# Patient Record
Sex: Male | Born: 1975 | Race: White | Hispanic: No | Marital: Married | Smoking: Current every day smoker
Health system: Southern US, Community
[De-identification: ages and names within clinical notes are randomized; demographics above are authoritative.]

## PROBLEM LIST (undated history)

## (undated) DIAGNOSIS — K5792 Diverticulitis of intestine, part unspecified, without perforation or abscess without bleeding: Secondary | ICD-10-CM

## (undated) DIAGNOSIS — T7840XA Allergy, unspecified, initial encounter: Secondary | ICD-10-CM

## (undated) HISTORY — DX: Allergy, unspecified, initial encounter: T78.40XA

## (undated) HISTORY — PX: SHOULDER SURGERY: SHX246

---

## 1999-03-01 HISTORY — PX: VASECTOMY: SHX75

## 1999-03-01 HISTORY — PX: OTHER SURGICAL HISTORY: SHX169

## 2007-07-03 ENCOUNTER — Encounter
Admission: RE | Admit: 2007-07-03 | Discharge: 2007-07-06 | Payer: Self-pay | Admitting: Physical Medicine & Rehabilitation

## 2007-07-06 ENCOUNTER — Ambulatory Visit: Payer: Self-pay | Admitting: Physical Medicine & Rehabilitation

## 2011-03-07 ENCOUNTER — Other Ambulatory Visit (HOSPITAL_COMMUNITY): Payer: Self-pay | Admitting: Internal Medicine

## 2011-03-07 DIAGNOSIS — R109 Unspecified abdominal pain: Secondary | ICD-10-CM

## 2011-03-09 ENCOUNTER — Other Ambulatory Visit (HOSPITAL_COMMUNITY): Payer: Self-pay

## 2011-03-10 ENCOUNTER — Other Ambulatory Visit (HOSPITAL_COMMUNITY): Payer: Self-pay

## 2011-03-11 ENCOUNTER — Ambulatory Visit (HOSPITAL_COMMUNITY)
Admission: RE | Admit: 2011-03-11 | Discharge: 2011-03-11 | Disposition: A | Discharge status: Home / Self Care | Payer: 59 | Source: Ambulatory Visit | Attending: Internal Medicine | Admitting: Internal Medicine

## 2011-03-11 ENCOUNTER — Other Ambulatory Visit (HOSPITAL_COMMUNITY): Payer: Self-pay | Admitting: Internal Medicine

## 2011-03-11 DIAGNOSIS — M25559 Pain in unspecified hip: Secondary | ICD-10-CM | POA: Insufficient documentation

## 2011-03-11 DIAGNOSIS — R109 Unspecified abdominal pain: Secondary | ICD-10-CM

## 2012-03-01 ENCOUNTER — Encounter (HOSPITAL_COMMUNITY): Payer: Self-pay | Admitting: Pharmacy Technician

## 2012-03-01 ENCOUNTER — Emergency Department (HOSPITAL_COMMUNITY)
Admission: EM | Admit: 2012-03-01 | Discharge: 2012-03-01 | Disposition: A | Discharge status: Home / Self Care | Payer: 59 | Attending: Emergency Medicine | Admitting: Emergency Medicine

## 2012-03-01 ENCOUNTER — Emergency Department (HOSPITAL_COMMUNITY): Payer: 59

## 2012-03-01 ENCOUNTER — Encounter (HOSPITAL_COMMUNITY): Payer: Self-pay | Admitting: *Deleted

## 2012-03-01 ENCOUNTER — Encounter (HOSPITAL_COMMUNITY)
Admission: RE | Admit: 2012-03-01 | Discharge: 2012-03-01 | Disposition: A | Discharge status: Home / Self Care | Payer: 59 | Source: Ambulatory Visit | Attending: General Surgery | Admitting: General Surgery

## 2012-03-01 ENCOUNTER — Encounter (HOSPITAL_COMMUNITY): Payer: Self-pay

## 2012-03-01 ENCOUNTER — Other Ambulatory Visit (HOSPITAL_COMMUNITY): Payer: 59

## 2012-03-01 DIAGNOSIS — K56609 Unspecified intestinal obstruction, unspecified as to partial versus complete obstruction: Secondary | ICD-10-CM

## 2012-03-01 DIAGNOSIS — F17200 Nicotine dependence, unspecified, uncomplicated: Secondary | ICD-10-CM | POA: Insufficient documentation

## 2012-03-01 DIAGNOSIS — R112 Nausea with vomiting, unspecified: Secondary | ICD-10-CM | POA: Insufficient documentation

## 2012-03-01 DIAGNOSIS — K4090 Unilateral inguinal hernia, without obstruction or gangrene, not specified as recurrent: Secondary | ICD-10-CM | POA: Insufficient documentation

## 2012-03-01 LAB — CBC WITH DIFFERENTIAL/PLATELET
Basophils Absolute: 0 10*3/uL (ref 0.0–0.1)
Basophils Relative: 0 % (ref 0–1)
Eosinophils Absolute: 0 10*3/uL (ref 0.0–0.7)
Eosinophils Relative: 0 % (ref 0–5)
HCT: 48.3 % (ref 39.0–52.0)
Hemoglobin: 17.2 g/dL — ABNORMAL HIGH (ref 13.0–17.0)
Lymphocytes Relative: 19 % (ref 12–46)
Lymphs Abs: 2.8 10*3/uL (ref 0.7–4.0)
MCH: 31.3 pg (ref 26.0–34.0)
MCHC: 35.6 g/dL (ref 30.0–36.0)
MCV: 87.8 fL (ref 78.0–100.0)
Monocytes Absolute: 0.9 10*3/uL (ref 0.1–1.0)
Monocytes Relative: 6 % (ref 3–12)
Neutro Abs: 11.3 10*3/uL — ABNORMAL HIGH (ref 1.7–7.7)
Neutrophils Relative %: 75 % (ref 43–77)
Platelets: 270 10*3/uL (ref 150–400)
RBC: 5.5 MIL/uL (ref 4.22–5.81)
RDW: 13.2 % (ref 11.5–15.5)
WBC: 15 10*3/uL — ABNORMAL HIGH (ref 4.0–10.5)

## 2012-03-01 LAB — COMPREHENSIVE METABOLIC PANEL
ALT: 14 U/L (ref 0–53)
AST: 15 U/L (ref 0–37)
Albumin: 4.2 g/dL (ref 3.5–5.2)
Alkaline Phosphatase: 67 U/L (ref 39–117)
BUN: 10 mg/dL (ref 6–23)
CO2: 25 mEq/L (ref 19–32)
Calcium: 10.3 mg/dL (ref 8.4–10.5)
Chloride: 100 mEq/L (ref 96–112)
Creatinine, Ser: 0.72 mg/dL (ref 0.50–1.35)
GFR calc Af Amer: >90 mL/min (ref >90)
GFR calc non Af Amer: >90 mL/min (ref >90)
Glucose, Bld: 121 mg/dL — ABNORMAL HIGH (ref 70–99)
Potassium: 3.6 mEq/L (ref 3.5–5.1)
Sodium: 139 mEq/L (ref 135–145)
Total Bilirubin: 0.4 mg/dL (ref 0.3–1.2)
Total Protein: 7.8 g/dL (ref 6.0–8.3)

## 2012-03-01 LAB — SURGICAL PCR SCREEN: Staphylococcus aureus: NEGATIVE

## 2012-03-01 LAB — LIPASE, BLOOD: Lipase: 39 U/L (ref 11–59)

## 2012-03-01 MED ORDER — ONDANSETRON HCL 4 MG/2ML IJ SOLN
4.0000 mg | Freq: Once | INTRAMUSCULAR | Status: AC
  Administered 2012-03-01: 4 mg via INTRAVENOUS
  Filled 2012-03-01: qty 2

## 2012-03-01 MED ORDER — HYDROMORPHONE HCL PF 1 MG/ML IJ SOLN
1.0000 mg | Freq: Once | INTRAMUSCULAR | Status: AC
  Administered 2012-03-01: 1 mg via INTRAVENOUS
  Filled 2012-03-01: qty 1

## 2012-03-01 MED ORDER — OXYCODONE-ACETAMINOPHEN 7.5-325 MG PO TABS: 1.0000 | ORAL_TABLET | ORAL | Status: DC | PRN

## 2012-03-01 MED ORDER — SODIUM CHLORIDE 0.9 % IV BOLUS (SEPSIS)
1000.0000 mL | Freq: Once | INTRAVENOUS | Status: AC
  Administered 2012-03-01: 1000 mL via INTRAVENOUS

## 2012-03-01 MED ORDER — IOHEXOL 300 MG/ML  SOLN
100.0000 mL | Freq: Once | INTRAMUSCULAR | Status: AC | PRN
  Administered 2012-03-01: 100 mL via INTRAVENOUS

## 2012-03-01 MED ORDER — PROMETHAZINE HCL 50 MG PO TABS: 50.0000 mg | ORAL_TABLET | Freq: Four times a day (QID) | ORAL | Status: DC | PRN

## 2012-03-01 MED ORDER — SODIUM CHLORIDE 0.9 % IV SOLN
Freq: Once | INTRAVENOUS | Status: AC
  Administered 2012-03-01: 11:00:00 via INTRAVENOUS

## 2012-03-01 MED ORDER — GI COCKTAIL ~~LOC~~
30.0000 mL | Freq: Once | ORAL | Status: AC
  Administered 2012-03-01: 30 mL via ORAL
  Filled 2012-03-01: qty 30

## 2012-03-01 MED ORDER — METOCLOPRAMIDE HCL 5 MG/ML IJ SOLN
10.0000 mg | Freq: Once | INTRAMUSCULAR | Status: AC
  Administered 2012-03-01: 10 mg via INTRAVENOUS
  Filled 2012-03-01: qty 2

## 2012-03-01 NOTE — ED Notes (Signed)
Pt vomited large amount of clear liquid. Pt reports drinking " a lot of water this morning"

## 2012-03-01 NOTE — Consult Note (Signed)
Patient seen in emergency room for incarcerated right inguinal hernia. The hernia reduced spontaneously. The patient has been scheduled for an elective right inguinal herniorrhaphy on 03/05/2012. Please see H&P. Discussed with Dr. Juleen China.

## 2012-03-01 NOTE — ED Notes (Signed)
NGT was attempted by two RN's and 2 different sizes tubes. All attempts unsuccessful. Dr. Juleen China aware.
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
## 2012-03-01 NOTE — Patient Instructions (Addendum)
20 Jordan Banks  03/01/2012   Your procedure is scheduled on:   03/05/2012  Report to [facility] at  1000 AM.  Call this number if you have problems the morning of surgery: [phone]   Remember:   Do not eat food:After Midnight.  May have clear liquids:until Midnight .    Take these medicines the morning of surgery with A SIP OF WATER: percocet,phenergan   Do not wear jewelry, make-up or nail polish.  Do not wear lotions, powders, or perfumes.   Do not shave 48 hours prior to surgery. Men may shave face and neck.  Do not bring valuables to the hospital.  Contacts, dentures or bridgework may not be worn into surgery.  Leave suitcase in the car. After surgery it may be brought to your room.  For patients admitted to the hospital, checkout time is 11:00 AM the day of discharge.   Patients discharged the day of surgery will not be allowed to drive home.  Name and phone number of your driver: family Special Instructions: Shower using CHG 2 nights before surgery and the night before surgery.  If you shower the day of surgery use CHG.  Use special wash - you have one bottle of CHG for all showers.  You should use approximately 1/3 of the bottle for each shower.   Please read over the following fact sheets that you were given: Pain Booklet, Coughing and Deep Breathing, MRSA Information, Surgical Site Infection Prevention, Anesthesia Post-op Instructions and Care and Recovery After Surgery Inguinal Hernia, Adult Muscles help keep everything in the body in its proper place. But if a weak spot in the muscles develops, something can poke through. That is called a hernia. When this happens in the lower part of the belly (abdomen), it is called an inguinal hernia. (It takes its name from a part of the body in this region called the inguinal canal.) A weak spot in the wall of muscles lets some fat or part of the small intestine bulge through. An inguinal hernia can develop at any age. Men get them  more often than women. CAUSES  In adults, an inguinal hernia develops over time.  It can be triggered by:  Suddenly straining the muscles of the lower abdomen.  Lifting heavy objects.  Straining to have a bowel movement. Difficult bowel movements (constipation) can lead to this.  Constant coughing. This may be caused by smoking or lung disease.  Being overweight.  Being pregnant.  Working at a job that requires long periods of standing or heavy lifting.  Having had an inguinal hernia before. One type can be an emergency situation. It is called a strangulated inguinal hernia. It develops if part of the small intestine slips through the weak spot and cannot get back into the abdomen. The blood supply can be cut off. If that happens, part of the intestine may die. This situation requires emergency surgery. SYMPTOMS  Often, a small inguinal hernia has no symptoms. It is found when a healthcare provider does a physical exam. Larger hernias usually have symptoms.   In adults, symptoms may include:  A lump in the groin. This is easier to see when the person is standing. It might disappear when lying down.  In men, a lump in the scrotum.  Pain or burning in the groin. This occurs especially when lifting, straining or coughing.  A dull ache or feeling of pressure in the groin.  Signs of a strangulated hernia can  include:  A bulge in the groin that becomes very painful and tender to the touch.  A bulge that turns red or purple.  Fever, nausea and vomiting.  Inability to have a bowel movement or to pass gas. DIAGNOSIS  To decide if you have an inguinal hernia, a healthcare provider will probably do a physical examination.  This will include asking questions about any symptoms you have noticed.  The healthcare provider might feel the groin area and ask you to cough. If an inguinal hernia is felt, the healthcare provider may try to slide it back into the abdomen.  Usually no  other tests are needed. TREATMENT  Treatments can vary. The size of the hernia makes a difference. Options include:  Watchful waiting. This is often suggested if the hernia is small and you have had no symptoms.  No medical procedure will be done unless symptoms develop.  You will need to watch closely for symptoms. If any occur, contact your healthcare provider right away.  Surgery. This is used if the hernia is larger or you have symptoms.  Open surgery. This is usually an outpatient procedure (you will not stay overnight in a hospital). An cut (incision) is made through the skin in the groin. The hernia is put back inside the abdomen. The weak area in the muscles is then repaired by herniorrhaphy or hernioplasty. Herniorrhaphy: in this type of surgery, the weak muscles are sewn back together. Hernioplasty: a patch or mesh is used to close the weak area in the abdominal wall.  Laparoscopy. In this procedure, a surgeon makes small incisions. A thin tube with a tiny video camera (called a laparoscope) is put into the abdomen. The surgeon repairs the hernia with mesh by looking with the video camera and using two long instruments. HOME CARE INSTRUCTIONS   After surgery to repair an inguinal hernia:  You will need to take pain medicine prescribed by your healthcare provider. Follow all directions carefully.  You will need to take care of the wound from the incision.  Your activity will be restricted for awhile. This will probably include no heavy lifting for several weeks. You also should not do anything too active for a few weeks. When you can return to work will depend on the type of job that you have.  During "watchful waiting" periods, you should:  Maintain a healthy weight.  Eat a diet high in fiber (fruits, vegetables and whole grains).  Drink plenty of fluids to avoid constipation. This means drinking enough water and other liquids to keep your urine clear or pale yellow.  Do  not lift heavy objects.  Do not stand for long periods of time.  Quit smoking. This should keep you from developing a frequent cough. SEEK MEDICAL CARE IF:   A bulge develops in your groin area.  You feel pain, a burning sensation or pressure in the groin. This might be worse if you are lifting or straining.  You develop a fever of more than 100.5 F (38.1 C). SEEK IMMEDIATE MEDICAL CARE IF:   Pain in the groin increases suddenly.  A bulge in the groin gets bigger suddenly and does not go down.  For men, there is sudden pain in the scrotum. Or, the size of the scrotum increases.  A bulge in the groin area becomes red or purple and is painful to touch.  You have nausea or vomiting that does not go away.  You feel your heart beating much faster than normal.  You cannot have a bowel movement or pass gas.  You develop a fever of more than 102.0 F (38.9 C). Document Released: 07/03/2008 Document Revised: 05/09/2011 Document Reviewed: 07/03/2008 [facility] Patient Information 2013 [place], [place]. PATIENT INSTRUCTIONS POST-ANESTHESIA  IMMEDIATELY FOLLOWING SURGERY:  Do not drive or operate machinery for the first twenty four hours after surgery.  Do not make any important decisions for twenty four hours after surgery or while taking narcotic pain medications or sedatives.  If you develop intractable nausea and vomiting or a severe headache please notify your doctor immediately.  FOLLOW-UP:  Please make an appointment with your surgeon as instructed. You do not need to follow up with anesthesia unless specifically instructed to do so.  WOUND CARE INSTRUCTIONS (if applicable):  Keep a dry clean dressing on the anesthesia/puncture wound site if there is drainage.  Once the wound has quit draining you may leave it open to air.  Generally you should leave the bandage intact for twenty four hours unless there is drainage.  If the epidural site drains for more than 36-48 hours please call  the anesthesia department.  QUESTIONS?:  Please feel free to call your physician or the hospital operator if you have any questions, and they will be happy to assist you.

## 2012-03-01 NOTE — ED Notes (Signed)
Jordan Banks in CT called- pt finished with water.

## 2012-03-01 NOTE — ED Notes (Signed)
Pt c/o abd pain that started yesteday, pt reports that he had the gi "bug" on Sunday, got better, abd pain started yesterday, n/v last night, does report constipation, last bowel Sunday when he had the "gi bug",.

## 2012-03-01 NOTE — H&P (Signed)
Jordan Banks is an 36 y.o. male.   Chief Complaint: Incarcerated right inguinal hernia HPI: Patient is a 36-year-old white male who presented to the emergency room with worsening right groin pain, swelling, and nausea. CT scan of the abdomen and pelvis revealed an incarcerated right inguinal hernia. It had small bowel incarcerated in it. The patient was given pain medication. A surgery consultation was obtained, but the hernia reduced spontaneously. The patient is now presenting to the operating room for elective right inguinal herniorrhaphy.  History reviewed. No pertinent past medical history.  Past Surgical History  Procedure Date  . Shoulder surgery     No family history on file. Social History:  reports that he has been smoking.  He does not have any smokeless tobacco history on file. He reports that he uses illicit drugs (Marijuana). He reports that he does not drink alcohol.  Allergies: No Known Allergies   (Not in a hospital admission)  Results for orders placed during the hospital encounter of 03/01/12 (from the past 48 hour(s))  CBC WITH DIFFERENTIAL     Status: Abnormal   Collection Time   03/01/12  7:58 AM      Component Value Range Comment   WBC 15.0 (*) 4.0 - 10.5 K/uL    RBC 5.50  4.22 - 5.81 MIL/uL    Hemoglobin 17.2 (*) 13.0 - 17.0 g/dL    HCT 48.3  39.0 - 52.0 %    MCV 87.8  78.0 - 100.0 fL    MCH 31.3  26.0 - 34.0 pg    MCHC 35.6  30.0 - 36.0 g/dL    RDW 13.2  11.5 - 15.5 %    Platelets 270  150 - 400 K/uL    Neutrophils Relative 75  43 - 77 %    Neutro Abs 11.3 (*) 1.7 - 7.7 K/uL    Lymphocytes Relative 19  12 - 46 %    Lymphs Abs 2.8  0.7 - 4.0 K/uL    Monocytes Relative 6  3 - 12 %    Monocytes Absolute 0.9  0.1 - 1.0 K/uL    Eosinophils Relative 0  0 - 5 %    Eosinophils Absolute 0.0  0.0 - 0.7 K/uL    Basophils Relative 0  0 - 1 %    Basophils Absolute 0.0  0.0 - 0.1 K/uL   COMPREHENSIVE METABOLIC PANEL     Status: Abnormal   Collection Time   03/01/12  7:58 AM      Component Value Range Comment   Sodium 139  135 - 145 mEq/L    Potassium 3.6  3.5 - 5.1 mEq/L    Chloride 100  96 - 112 mEq/L    CO2 25  19 - 32 mEq/L    Glucose, Bld 121 (*) 70 - 99 mg/dL    BUN 10  6 - 23 mg/dL    Creatinine, Ser 0.72  0.50 - 1.35 mg/dL    Calcium 10.3  8.4 - 10.5 mg/dL    Total Protein 7.8  6.0 - 8.3 g/dL    Albumin 4.2  3.5 - 5.2 g/dL    AST 15  0 - 37 U/L    ALT 14  0 - 53 U/L    Alkaline Phosphatase 67  39 - 117 U/L    Total Bilirubin 0.4  0.3 - 1.2 mg/dL    GFR calc non Af Amer >90  >90 mL/min    GFR calc Af Amer >90  >  90 mL/min   LIPASE, BLOOD     Status: Normal   Collection Time   03/01/12  7:58 AM      Component Value Range Comment   Lipase 39  11 - 59 U/L    Ct Abdomen Pelvis W Contrast  03/01/2012  *RADIOLOGY REPORT*  Clinical Data: Epigastric pain with nausea and vomiting.  CT ABDOMEN AND PELVIS WITH CONTRAST  Technique:  Multidetector CT imaging of the abdomen and pelvis was performed following the standard protocol during bolus administration of intravenous contrast.  Contrast: 100mL OMNIPAQUE IOHEXOL 300 MG/ML  SOLN  Comparison: None.  Findings: Lung bases show minimal dependent atelectasis.  Heart size normal.  Scattered sub centimeter low attenuation lesions in the liver are too small to characterize but likely represent cysts and/or hemangiomas.  Gallbladder, adrenal glands, kidneys, spleen, and pancreas are unremarkable.  Stomach is dilated with oral contrast.  Duodenal and proximal jejunum are nondistended.  There are dilated loops of fluid-filled mid small bowel in the central and left abdomen, to the level of a right inguinal hernia (image 39). Distal small bowel is decompressed.  Colon is decompressed and unremarkable.  Small pelvic free fluid.  No free air.  Small left inguinal hernia contains fat.  Scattered atherosclerotic calcification of the arterial vasculature without abdominal aortic aneurysm.  No pathologically enlarged  lymph nodes.  Scattered small bowel mesenteric lymph nodes are sub centimeter in short axis size.  No worrisome lytic or sclerotic lesions.  Old fractures involving the pelvis are noted.  IMPRESSION:  1.  Small bowel obstruction secondary to an incarcerated right inguinal hernia. 2.  Small pelvic free fluid.   Original Report Authenticated By: Melinda Blietz, M.D.     Review of Systems  Constitutional: Negative.   HENT: Negative.   Eyes: Negative.   Respiratory: Negative.   Cardiovascular: Negative.   Gastrointestinal: Positive for nausea and abdominal pain.  Genitourinary: Negative.   Musculoskeletal: Negative.   Skin: Negative.   Neurological: Negative.   Endo/Heme/Allergies: Negative.   Psychiatric/Behavioral: Negative.     Blood pressure 105/57, pulse 64, temperature 97.3 F (36.3 C), resp. rate 18, height 6' 1" (1.854 m), weight 90.719 kg (200 lb), SpO2 97.00%. Physical Exam  Vitals reviewed. Constitutional: He is oriented to person, place, and time. He appears well-developed and well-nourished.  HENT:  Head: Normocephalic and atraumatic.  Neck: Normal range of motion. Neck supple.  Cardiovascular: Normal rate, regular rhythm and normal heart sounds.   Respiratory: Effort normal and breath sounds normal.  GI: Soft. Bowel sounds are normal. He exhibits no distension. There is no rebound.       Reducible right inguinal hernia noted.  Small left inguinal hernia.  Musculoskeletal: Normal range of motion.  Neurological: He is alert and oriented to person, place, and time.  Skin: Skin is warm and dry.  Psychiatric: He has a normal mood and affect. His behavior is normal. Judgment and thought content normal.     Assessment/Plan Impression: Right inguinal hernia Plan: The patient is scheduled for a right inguinal herniorrhaphy on 03/05/2012. The risks and benefits of the procedure including bleeding, infection, and the possibility of recurrence of the hernia were fully explained to  the patient, who gave informed consent.  Rik Wadel A 03/01/2012, 11:14 AM    

## 2012-03-01 NOTE — ED Notes (Signed)
Dr. Lovell Sheehan in and discussed options with pt. Pt now up for DC and scheduled for surgery Monday 03-05-12.

## 2012-03-01 NOTE — ED Notes (Signed)
Pt requesting more pain medication.

## 2012-03-01 NOTE — ED Notes (Addendum)
Dr. Lovell Sheehan in room to assess pt

## 2012-03-05 ENCOUNTER — Ambulatory Visit (HOSPITAL_COMMUNITY): Payer: 59 | Admitting: Anesthesiology

## 2012-03-05 ENCOUNTER — Encounter (HOSPITAL_COMMUNITY): Payer: Self-pay | Admitting: *Deleted

## 2012-03-05 ENCOUNTER — Encounter (HOSPITAL_COMMUNITY)
Admission: RE | Disposition: A | Discharge status: Home / Self Care | Payer: Self-pay | Source: Ambulatory Visit | Attending: General Surgery

## 2012-03-05 ENCOUNTER — Encounter (HOSPITAL_COMMUNITY): Payer: Self-pay | Admitting: Anesthesiology

## 2012-03-05 ENCOUNTER — Ambulatory Visit (HOSPITAL_COMMUNITY)
Admission: RE | Admit: 2012-03-05 | Discharge: 2012-03-05 | Disposition: A | Discharge status: Home / Self Care | Payer: 59 | Source: Ambulatory Visit | Attending: General Surgery | Admitting: General Surgery

## 2012-03-05 DIAGNOSIS — K4090 Unilateral inguinal hernia, without obstruction or gangrene, not specified as recurrent: Secondary | ICD-10-CM | POA: Insufficient documentation

## 2012-03-05 HISTORY — PX: INGUINAL HERNIA REPAIR: SHX194

## 2012-03-05 HISTORY — PX: INSERTION OF MESH: SHX5868

## 2012-03-05 SURGERY — REPAIR, HERNIA, INGUINAL, ADULT
Anesthesia: General | Site: Groin | Laterality: Right | Wound class: Clean

## 2012-03-05 MED ORDER — KETOROLAC TROMETHAMINE 30 MG/ML IJ SOLN
INTRAMUSCULAR | Status: AC
  Filled 2012-03-05: qty 1

## 2012-03-05 MED ORDER — ONDANSETRON HCL 4 MG/2ML IJ SOLN: 4.0000 mg | Freq: Once | INTRAMUSCULAR | Status: DC | PRN

## 2012-03-05 MED ORDER — CEFAZOLIN SODIUM-DEXTROSE 2-3 GM-% IV SOLR
2.0000 g | INTRAVENOUS | Status: AC
  Administered 2012-03-05: 2 g via INTRAVENOUS

## 2012-03-05 MED ORDER — ENOXAPARIN SODIUM 40 MG/0.4ML ~~LOC~~ SOLN
SUBCUTANEOUS | Status: AC
  Filled 2012-03-05: qty 0.4

## 2012-03-05 MED ORDER — BUPIVACAINE LIPOSOME 1.3 % IJ SUSP
20.0000 mL | Freq: Once | INTRAMUSCULAR | Status: DC
  Filled 2012-03-05: qty 20

## 2012-03-05 MED ORDER — OXYCODONE-ACETAMINOPHEN 7.5-325 MG PO TABS: 1.0000 | ORAL_TABLET | ORAL | Status: DC | PRN

## 2012-03-05 MED ORDER — FENTANYL CITRATE 0.05 MG/ML IJ SOLN
INTRAMUSCULAR | Status: AC
  Filled 2012-03-05: qty 2

## 2012-03-05 MED ORDER — MIDAZOLAM HCL 2 MG/2ML IJ SOLN
1.0000 mg | INTRAMUSCULAR | Status: DC | PRN
  Administered 2012-03-05 (×2): 2 mg via INTRAVENOUS

## 2012-03-05 MED ORDER — LIDOCAINE HCL (CARDIAC) 10 MG/ML IV SOLN
INTRAVENOUS | Status: DC | PRN
  Administered 2012-03-05: 20 mg via INTRAVENOUS

## 2012-03-05 MED ORDER — MIDAZOLAM HCL 2 MG/2ML IJ SOLN
INTRAMUSCULAR | Status: AC
  Filled 2012-03-05: qty 2

## 2012-03-05 MED ORDER — BUPIVACAINE LIPOSOME 1.3 % IJ SUSP
INTRAMUSCULAR | Status: DC | PRN
  Administered 2012-03-05: 20 mL

## 2012-03-05 MED ORDER — KETOROLAC TROMETHAMINE 30 MG/ML IJ SOLN
30.0000 mg | Freq: Once | INTRAMUSCULAR | Status: AC
  Administered 2012-03-05: 30 mg via INTRAVENOUS

## 2012-03-05 MED ORDER — LACTATED RINGERS IV SOLN
INTRAVENOUS | Status: DC
  Administered 2012-03-05: 11:00:00 via INTRAVENOUS

## 2012-03-05 MED ORDER — PROPOFOL 10 MG/ML IV BOLUS
INTRAVENOUS | Status: DC | PRN
  Administered 2012-03-05: 150 mg via INTRAVENOUS
  Administered 2012-03-05: 30 mg via INTRAVENOUS

## 2012-03-05 MED ORDER — PROPOFOL 10 MG/ML IV EMUL
INTRAVENOUS | Status: AC
  Filled 2012-03-05: qty 20

## 2012-03-05 MED ORDER — FENTANYL CITRATE 0.05 MG/ML IJ SOLN
INTRAMUSCULAR | Status: DC | PRN
  Administered 2012-03-05 (×4): 50 ug via INTRAVENOUS

## 2012-03-05 MED ORDER — ENOXAPARIN SODIUM 40 MG/0.4ML ~~LOC~~ SOLN
40.0000 mg | Freq: Once | SUBCUTANEOUS | Status: AC
  Administered 2012-03-05: 40 mg via SUBCUTANEOUS

## 2012-03-05 MED ORDER — CHLORHEXIDINE GLUCONATE 4 % EX LIQD: 1.0000 "application " | Freq: Once | CUTANEOUS | Status: DC

## 2012-03-05 MED ORDER — FENTANYL CITRATE 0.05 MG/ML IJ SOLN
25.0000 ug | INTRAMUSCULAR | Status: DC | PRN
  Administered 2012-03-05 (×2): 50 ug via INTRAVENOUS

## 2012-03-05 MED ORDER — LIDOCAINE HCL (PF) 1 % IJ SOLN
INTRAMUSCULAR | Status: AC
  Filled 2012-03-05: qty 5

## 2012-03-05 MED ORDER — CEFAZOLIN SODIUM-DEXTROSE 2-3 GM-% IV SOLR
INTRAVENOUS | Status: AC
  Filled 2012-03-05: qty 50

## 2012-03-05 MED ORDER — SODIUM CHLORIDE 0.9 % IR SOLN
Status: DC | PRN
  Administered 2012-03-05: 1000 mL

## 2012-03-05 SURGICAL SUPPLY — 45 items
BAG HAMPER (MISCELLANEOUS) ×2 IMPLANT
CLOTH BEACON ORANGE TIMEOUT ST (SAFETY) ×2 IMPLANT
COVER LIGHT HANDLE STERIS (MISCELLANEOUS) ×4 IMPLANT
DECANTER SPIKE VIAL GLASS SM (MISCELLANEOUS) IMPLANT
DERMABOND ADVANCED (GAUZE/BANDAGES/DRESSINGS) ×1
DERMABOND ADVANCED .7 DNX12 (GAUZE/BANDAGES/DRESSINGS) ×1 IMPLANT
DRAIN PENROSE 18X.75 LTX STRL (MISCELLANEOUS) ×2 IMPLANT
ELECT REM PT RETURN 9FT ADLT (ELECTROSURGICAL) ×4
ELECTRODE REM PT RTRN 9FT ADLT (ELECTROSURGICAL) ×2 IMPLANT
FORMALIN 10 PREFIL 120ML (MISCELLANEOUS) ×2 IMPLANT
GLOVE BIO SURGEON STRL SZ7.5 (GLOVE) ×2 IMPLANT
GLOVE BIOGEL PI IND STRL 6.5 (GLOVE) ×1 IMPLANT
GLOVE BIOGEL PI IND STRL 7.0 (GLOVE) ×3 IMPLANT
GLOVE BIOGEL PI INDICATOR 6.5 (GLOVE) ×1
GLOVE BIOGEL PI INDICATOR 7.0 (GLOVE) ×3
GLOVE ECLIPSE 6.5 STRL STRAW (GLOVE) ×2 IMPLANT
GLOVE ECLIPSE 7.5 STRL STRAW (GLOVE) ×2 IMPLANT
GLOVE INDICATOR 7.5 STRL GRN (GLOVE) ×2 IMPLANT
GLOVE SS BIOGEL STRL SZ 6.5 (GLOVE) ×2 IMPLANT
GLOVE SUPERSENSE BIOGEL SZ 6.5 (GLOVE) ×2
GOWN STRL REIN XL XLG (GOWN DISPOSABLE) ×10 IMPLANT
INST SET MINOR GENERAL (KITS) ×2 IMPLANT
KIT ROOM TURNOVER APOR (KITS) ×2 IMPLANT
MANIFOLD NEPTUNE II (INSTRUMENTS) ×2 IMPLANT
MESH MARLEX PLUG MEDIUM (Mesh General) ×2 IMPLANT
NEEDLE HYPO 18GX1.5 BLUNT FILL (NEEDLE) ×2 IMPLANT
NEEDLE HYPO 25X1 1.5 SAFETY (NEEDLE) ×2 IMPLANT
NS IRRIG 1000ML POUR BTL (IV SOLUTION) ×2 IMPLANT
PACK MINOR (CUSTOM PROCEDURE TRAY) ×2 IMPLANT
PAD ARMBOARD 7.5X6 YLW CONV (MISCELLANEOUS) ×2 IMPLANT
SET BASIN LINEN APH (SET/KITS/TRAYS/PACK) ×2 IMPLANT
SOL PREP PROV IODINE SCRUB 4OZ (MISCELLANEOUS) ×2 IMPLANT
SUT NOVA NAB GS-22 2 2-0 T-19 (SUTURE) ×4 IMPLANT
SUT NOVAFIL NAB HGS22 2-0 30IN (SUTURE) ×2 IMPLANT
SUT SILK 3 0 (SUTURE)
SUT SILK 3-0 18XBRD TIE 12 (SUTURE) IMPLANT
SUT VIC AB 2-0 CT1 27 (SUTURE)
SUT VIC AB 2-0 CT1 TAPERPNT 27 (SUTURE) IMPLANT
SUT VIC AB 3-0 SH 27 (SUTURE) ×1
SUT VIC AB 3-0 SH 27X BRD (SUTURE) ×1 IMPLANT
SUT VIC AB 4-0 PS2 27 (SUTURE) ×4 IMPLANT
SUT VICRYL AB 3 0 TIES (SUTURE) IMPLANT
SYR 20CC LL (SYRINGE) ×2 IMPLANT
SYR CONTROL 10ML LL (SYRINGE) ×2 IMPLANT
TOWEL OR 17X26 4PK STRL BLUE (TOWEL DISPOSABLE) ×2 IMPLANT

## 2012-03-05 NOTE — Anesthesia Preprocedure Evaluation (Addendum)
Anesthesia Evaluation  Patient identified by MRN, date of birth, ID band Patient awake    Reviewed: Allergy & Precautions, H&P , NPO status , Patient's Chart, lab work & pertinent test results  Airway Mallampati: I  TM Distance: >3 FB     Dental  (+) Teeth Intact   Pulmonary Current Smoker,    breath sounds clear to auscultation       Cardiovascular negative cardio ROS   Rhythm:Regular Rate:Normal     Neuro/Psych    GI/Hepatic negative GI ROS,   Endo/Other    Renal/GU      Musculoskeletal   Abdominal   Peds  Hematology   Anesthesia Other Findings   Reproductive/Obstetrics                             Anesthesia Physical Anesthesia Plan  ASA: I  Anesthesia Plan: General   Post-op Pain Management:    Induction: Intravenous  Airway Management Planned: LMA  Additional Equipment:   Intra-op Plan:   Post-operative Plan: Extubation in OR  Informed Consent: I have reviewed the patients History and Physical, chart, labs and discussed the procedure including the risks, benefits and alternatives for the proposed anesthesia with the patient or authorized representative who has indicated his/her understanding and acceptance.     Plan Discussed with:   Anesthesia Plan Comments:         Anesthesia Quick Evaluation  

## 2012-03-05 NOTE — H&P (View-Only) (Signed)
Jordan Banks is an 37 y.o. male.   Chief Complaint: Incarcerated right inguinal hernia HPI: Patient is a 37 year old white male who presented to the emergency room with worsening right groin pain, swelling, and nausea. CT scan of the abdomen and pelvis revealed an incarcerated right inguinal hernia. It had small bowel incarcerated in it. The patient was given pain medication. A surgery consultation was obtained, but the hernia reduced spontaneously. The patient is now presenting to the operating room for elective right inguinal herniorrhaphy.  History reviewed. No pertinent past medical history.  Past Surgical History  Procedure Date  . Shoulder surgery     No family history on file. Social History:  reports that he has been smoking.  He does not have any smokeless tobacco history on file. He reports that he uses illicit drugs (Marijuana). He reports that he does not drink alcohol.  Allergies: No Known Allergies   (Not in a hospital admission)  Results for orders placed during the hospital encounter of 03/01/12 (from the past 48 hour(s))  CBC WITH DIFFERENTIAL     Status: Abnormal   Collection Time   03/01/12  7:58 AM      Component Value Range Comment   WBC 15.0 (*) 4.0 - 10.5 K/uL    RBC 5.50  4.22 - 5.81 MIL/uL    Hemoglobin 17.2 (*) 13.0 - 17.0 g/dL    HCT 09.8  11.9 - 14.7 %    MCV 87.8  78.0 - 100.0 fL    MCH 31.3  26.0 - 34.0 pg    MCHC 35.6  30.0 - 36.0 g/dL    RDW 82.9  56.2 - 13.0 %    Platelets 270  150 - 400 K/uL    Neutrophils Relative 75  43 - 77 %    Neutro Abs 11.3 (*) 1.7 - 7.7 K/uL    Lymphocytes Relative 19  12 - 46 %    Lymphs Abs 2.8  0.7 - 4.0 K/uL    Monocytes Relative 6  3 - 12 %    Monocytes Absolute 0.9  0.1 - 1.0 K/uL    Eosinophils Relative 0  0 - 5 %    Eosinophils Absolute 0.0  0.0 - 0.7 K/uL    Basophils Relative 0  0 - 1 %    Basophils Absolute 0.0  0.0 - 0.1 K/uL   COMPREHENSIVE METABOLIC PANEL     Status: Abnormal   Collection Time   03/01/12  7:58 AM      Component Value Range Comment   Sodium 139  135 - 145 mEq/L    Potassium 3.6  3.5 - 5.1 mEq/L    Chloride 100  96 - 112 mEq/L    CO2 25  19 - 32 mEq/L    Glucose, Bld 121 (*) 70 - 99 mg/dL    BUN 10  6 - 23 mg/dL    Creatinine, Ser 8.65  0.50 - 1.35 mg/dL    Calcium 78.4  8.4 - 10.5 mg/dL    Total Protein 7.8  6.0 - 8.3 g/dL    Albumin 4.2  3.5 - 5.2 g/dL    AST 15  0 - 37 U/L    ALT 14  0 - 53 U/L    Alkaline Phosphatase 67  39 - 117 U/L    Total Bilirubin 0.4  0.3 - 1.2 mg/dL    GFR calc non Af Amer >90  >90 mL/min    GFR calc Af Amer >90  >  90 mL/min   LIPASE, BLOOD     Status: Normal   Collection Time   03/01/12  7:58 AM      Component Value Range Comment   Lipase 39  11 - 59 U/L    Ct Abdomen Pelvis W Contrast  03/01/2012  *RADIOLOGY REPORT*  Clinical Data: Epigastric pain with nausea and vomiting.  CT ABDOMEN AND PELVIS WITH CONTRAST  Technique:  Multidetector CT imaging of the abdomen and pelvis was performed following the standard protocol during bolus administration of intravenous contrast.  Contrast: OMNIPAQUE IOHEXOL 300 MG/ML  SOLN  Comparison: None.  Findings: Lung bases show minimal dependent atelectasis.  Heart size normal.  Scattered sub centimeter low attenuation lesions in the liver are too small to characterize but likely represent cysts and/or hemangiomas.  Gallbladder, adrenal glands, kidneys, spleen, and pancreas are unremarkable.  Stomach is dilated with oral contrast.  Duodenal and proximal jejunum are nondistended.  There are dilated loops of fluid-filled mid small bowel in the central and left abdomen, to the level of a right inguinal hernia (image 39). Distal small bowel is decompressed.  Colon is decompressed and unremarkable.  Small pelvic free fluid.  No free air.  Small left inguinal hernia contains fat.  Scattered atherosclerotic calcification of the arterial vasculature without abdominal aortic aneurysm.  No pathologically enlarged  lymph nodes.  Scattered small bowel mesenteric lymph nodes are sub centimeter in short axis size.  No worrisome lytic or sclerotic lesions.  Old fractures involving the pelvis are noted.  IMPRESSION:  1.  Small bowel obstruction secondary to an incarcerated right inguinal hernia. 2.  Small pelvic free fluid.   Original Report Authenticated By: Leanna Battles, M.D.     Review of Systems  Constitutional: Negative.   HENT: Negative.   Eyes: Negative.   Respiratory: Negative.   Cardiovascular: Negative.   Gastrointestinal: Positive for nausea and abdominal pain.  Genitourinary: Negative.   Musculoskeletal: Negative.   Skin: Negative.   Neurological: Negative.   Endo/Heme/Allergies: Negative.   Psychiatric/Behavioral: Negative.     Blood pressure 105/57, pulse 64, temperature 97.3 F (36.3 C), resp. rate 18, height 6\' 1"  (1.854 m), weight 90.719 kg (200 lb), SpO2 97.00%. Physical Exam  Vitals reviewed. Constitutional: He is oriented to person, place, and time. He appears well-developed and well-nourished.  HENT:  Head: Normocephalic and atraumatic.  Neck: Normal range of motion. Neck supple.  Cardiovascular: Normal rate, regular rhythm and normal heart sounds.   Respiratory: Effort normal and breath sounds normal.  GI: Soft. Bowel sounds are normal. He exhibits no distension. There is no rebound.       Reducible right inguinal hernia noted.  Small left inguinal hernia.  Musculoskeletal: Normal range of motion.  Neurological: He is alert and oriented to person, place, and time.  Skin: Skin is warm and dry.  Psychiatric: He has a normal mood and affect. His behavior is normal. Judgment and thought content normal.     Assessment/Plan Impression: Right inguinal hernia Plan: The patient is scheduled for a right inguinal herniorrhaphy on 03/05/2012. The risks and benefits of the procedure including bleeding, infection, and the possibility of recurrence of the hernia were fully explained to  the patient, who gave informed consent.  Elaina Cara A 03/01/2012, 11:14 AM

## 2012-03-05 NOTE — Interval H&P Note (Signed)
History and Physical Interval Note:  03/05/2012 11:01 AM  Jordan Banks  has presented today for surgery, with the diagnosis of Right inguinal hernia  The various methods of treatment have been discussed with the patient and family. After consideration of risks, benefits and other options for treatment, the patient has consented to  Procedure(s) (LRB) with comments: HERNIA REPAIR INGUINAL ADULT (Right) as a surgical intervention .  The patient's history has been reviewed, patient examined, no change in status, stable for surgery.  I have reviewed the patient's chart and labs.  Questions were answered to the patient's satisfaction.     Franky Macho A

## 2012-03-05 NOTE — Op Note (Signed)
Patient:  Jordan Banks  DOB:  Feb 18, 1976  MRN:  [id number]   Preop Diagnosis:  Right inguinal hernia  Postop Diagnosis:  Same  Procedure:  Right inguinal herniorrhaphy with mesh  Surgeon:  Franky Macho, M.D.  Anes:  General  Indications:  Patient is a 37 year old white male who presents with a symptomatic right inguinal hernia. The risks and benefits of the procedure including bleeding, infection, chronic pain, and the possibility of recurrence of the hernia were fully explained to the patient, who gave informed consent.  Procedure note:  The patient was placed the supine position. After general anesthesia was administered, the right groin region was prepped and draped using the usual sterile technique with Betadine. Surgical site confirmation was performed.  A right groin incision was made down to the external oblique aponeuroses. The aponeuroses was incised to the external ring. A Penrose drain was placed around the spermatic cord. The vase deferens was noted within the spermatic cord. A direct hernia sac was found. This was freed away from the spermatic cord up to the peritoneal reflection and inverted. A Bard PerFix plug, medium size, was then inserted into this area and secured to the transversalis fascia using 2-0 Novafil interrupted sutures. An onlay patch was then placed along the floor of inguinal canal. The superior aspect was secured to the conjoined tendon and the inferior aspect secured to the shelving edge of Poupart's ligament using 2-0 Novafil interrupted sutures. The internal ring was recreated using a 20 Novafil interrupted suture. Care was taken to avoid the ilioinguinal nerve. The external oblique aponeuroses was reapproximated using a 2-0 Vicryl running suture. The subcutaneous layer was reapproximated using 3-0 Vicryl interrupted suture. Exparel was then injected into the fascia and surrounding subcutaneous tissue. The skin was closed using a 4-0 Vicryl subcuticular  suture. Dermabond was then applied.  All tape and needle counts were correct at the end of the procedure. The patient was awakened and transferred to PACU in stable condition.  Complications:  None  EBL:  Minimal  Specimen:  None

## 2012-03-05 NOTE — Transfer of Care (Signed)
Immediate Anesthesia Transfer of Care Note  Patient: Jordan Banks  Procedure(s) Performed: Procedure(s) (LRB): HERNIA REPAIR INGUINAL ADULT (Right) INSERTION OF MESH (Right)  Patient Location: PACU  Anesthesia Type: General  Level of Consciousness: awake  Airway & Oxygen Therapy: Patient Spontanous Breathing and non-rebreather face mask  Post-op Assessment: Report given to PACU RN, Post -op Vital signs reviewed and stable and Patient moving all extremities  Post vital signs: Reviewed and stable  Complications: No apparent anesthesia complications

## 2012-03-05 NOTE — Anesthesia Procedure Notes (Signed)
Procedure Name: LMA Insertion Date/Time: 03/05/2012 11:26 AM Performed by: Franco Nones Pre-anesthesia Checklist: Patient identified, Patient being monitored, Emergency Drugs available, Timeout performed and Suction available Patient Re-evaluated:Patient Re-evaluated prior to inductionOxygen Delivery Method: Circle System Utilized Preoxygenation: Pre-oxygenation with 100% oxygen Intubation Type: IV induction Ventilation: Mask ventilation without difficulty LMA: LMA inserted LMA Size: 4.0 Number of attempts: 1 Placement Confirmation: positive ETCO2 and breath sounds checked- equal and bilateral

## 2012-03-05 NOTE — Anesthesia Postprocedure Evaluation (Signed)
Anesthesia Post Note  Patient: Jordan Banks  Procedure(s) Performed: Procedure(s) (LRB): HERNIA REPAIR INGUINAL ADULT (Right) INSERTION OF MESH (Right)  Anesthesia type: General  Patient location: PACU  Post pain: Pain level controlled  Post assessment: Post-op Vital signs reviewed, Patient's Cardiovascular Status Stable, Respiratory Function Stable, Patent Airway, No signs of Nausea or vomiting and Pain level controlled  Last Vitals:  Filed Vitals:   03/05/12 1238  BP: 136/89  Pulse: 56  Temp: 36.3 C  Resp: 15    Post vital signs: Reviewed and stable  Level of consciousness: awake and alert   Complications: No apparent anesthesia complications

## 2012-03-06 ENCOUNTER — Encounter (HOSPITAL_COMMUNITY): Payer: Self-pay | Admitting: General Surgery

## 2014-04-18 ENCOUNTER — Other Ambulatory Visit (HOSPITAL_COMMUNITY): Payer: Self-pay | Admitting: Family Medicine

## 2014-04-18 ENCOUNTER — Ambulatory Visit (HOSPITAL_COMMUNITY)
Admission: RE | Admit: 2014-04-18 | Discharge: 2014-04-18 | Disposition: A | Discharge status: Home / Self Care | Payer: BLUE CROSS/BLUE SHIELD | Source: Ambulatory Visit | Attending: Family Medicine | Admitting: Family Medicine

## 2014-04-18 DIAGNOSIS — M5432 Sciatica, left side: Secondary | ICD-10-CM

## 2014-04-18 DIAGNOSIS — M5440 Lumbago with sciatica, unspecified side: Secondary | ICD-10-CM | POA: Insufficient documentation

## 2014-04-18 DIAGNOSIS — M5431 Sciatica, right side: Secondary | ICD-10-CM

## 2014-04-21 ENCOUNTER — Other Ambulatory Visit (HOSPITAL_COMMUNITY): Payer: Self-pay | Admitting: Family Medicine

## 2014-04-21 DIAGNOSIS — M5416 Radiculopathy, lumbar region: Secondary | ICD-10-CM

## 2014-04-21 DIAGNOSIS — M5432 Sciatica, left side: Secondary | ICD-10-CM

## 2014-04-21 DIAGNOSIS — M545 Low back pain: Secondary | ICD-10-CM

## 2014-04-28 ENCOUNTER — Ambulatory Visit (HOSPITAL_COMMUNITY)
Admission: RE | Admit: 2014-04-28 | Discharge: 2014-04-28 | Disposition: A | Discharge status: Home / Self Care | Payer: BLUE CROSS/BLUE SHIELD | Source: Ambulatory Visit | Attending: Family Medicine | Admitting: Family Medicine

## 2014-04-28 DIAGNOSIS — M5416 Radiculopathy, lumbar region: Secondary | ICD-10-CM

## 2014-04-28 DIAGNOSIS — M5127 Other intervertebral disc displacement, lumbosacral region: Secondary | ICD-10-CM | POA: Insufficient documentation

## 2014-04-28 DIAGNOSIS — M545 Low back pain: Secondary | ICD-10-CM | POA: Diagnosis present

## 2014-04-28 DIAGNOSIS — M5137 Other intervertebral disc degeneration, lumbosacral region: Secondary | ICD-10-CM | POA: Diagnosis not present

## 2014-04-28 DIAGNOSIS — M79605 Pain in left leg: Secondary | ICD-10-CM | POA: Diagnosis not present

## 2014-04-28 DIAGNOSIS — M5136 Other intervertebral disc degeneration, lumbar region: Secondary | ICD-10-CM | POA: Insufficient documentation

## 2014-04-28 DIAGNOSIS — M5432 Sciatica, left side: Secondary | ICD-10-CM

## 2015-08-02 ENCOUNTER — Encounter (HOSPITAL_COMMUNITY): Payer: Self-pay | Admitting: *Deleted

## 2015-08-02 ENCOUNTER — Emergency Department (HOSPITAL_COMMUNITY): Payer: BLUE CROSS/BLUE SHIELD

## 2015-08-02 ENCOUNTER — Emergency Department (HOSPITAL_COMMUNITY)
Admission: EM | Admit: 2015-08-02 | Discharge: 2015-08-02 | Disposition: A | Discharge status: Home / Self Care | Payer: BLUE CROSS/BLUE SHIELD | Attending: Emergency Medicine | Admitting: Emergency Medicine

## 2015-08-02 DIAGNOSIS — K5732 Diverticulitis of large intestine without perforation or abscess without bleeding: Secondary | ICD-10-CM | POA: Insufficient documentation

## 2015-08-02 DIAGNOSIS — K4090 Unilateral inguinal hernia, without obstruction or gangrene, not specified as recurrent: Secondary | ICD-10-CM

## 2015-08-02 DIAGNOSIS — F17210 Nicotine dependence, cigarettes, uncomplicated: Secondary | ICD-10-CM | POA: Insufficient documentation

## 2015-08-02 LAB — COMPREHENSIVE METABOLIC PANEL
ALT: 19 U/L (ref 17–63)
ANION GAP: 7 (ref 5–15)
AST: 20 U/L (ref 15–41)
Albumin: 3.9 g/dL (ref 3.5–5.0)
Alkaline Phosphatase: 60 U/L (ref 38–126)
BILIRUBIN TOTAL: 0.4 mg/dL (ref 0.3–1.2)
BUN: 13 mg/dL (ref 6–20)
CHLORIDE: 106 mmol/L (ref 101–111)
CO2: 26 mmol/L (ref 22–32)
Calcium: 9.1 mg/dL (ref 8.9–10.3)
Creatinine, Ser: 0.85 mg/dL (ref 0.61–1.24)
Glucose, Bld: 93 mg/dL (ref 65–99)
POTASSIUM: 4.1 mmol/L (ref 3.5–5.1)
Sodium: 139 mmol/L (ref 135–145)
TOTAL PROTEIN: 7.4 g/dL (ref 6.5–8.1)

## 2015-08-02 LAB — CBC WITH DIFFERENTIAL/PLATELET
Basophils Absolute: 0 10*3/uL (ref 0.0–0.1)
Basophils Relative: 0 %
EOS PCT: 2 %
Eosinophils Absolute: 0.3 10*3/uL (ref 0.0–0.7)
HEMATOCRIT: 44.7 % (ref 39.0–52.0)
Hemoglobin: 15.1 g/dL (ref 13.0–17.0)
LYMPHS PCT: 37 %
Lymphs Abs: 4.7 10*3/uL — ABNORMAL HIGH (ref 0.7–4.0)
MCH: 30.7 pg (ref 26.0–34.0)
MCHC: 33.8 g/dL (ref 30.0–36.0)
MCV: 90.9 fL (ref 78.0–100.0)
MONO ABS: 0.7 10*3/uL (ref 0.1–1.0)
MONOS PCT: 6 %
NEUTROS ABS: 7.1 10*3/uL (ref 1.7–7.7)
Neutrophils Relative %: 55 %
PLATELETS: 289 10*3/uL (ref 150–400)
RBC: 4.92 MIL/uL (ref 4.22–5.81)
RDW: 13.2 % (ref 11.5–15.5)
WBC: 12.9 10*3/uL — ABNORMAL HIGH (ref 4.0–10.5)

## 2015-08-02 LAB — URINALYSIS, ROUTINE W REFLEX MICROSCOPIC
Bilirubin Urine: NEGATIVE
Glucose, UA: NEGATIVE mg/dL
HGB URINE DIPSTICK: NEGATIVE
Ketones, ur: NEGATIVE mg/dL
Leukocytes, UA: NEGATIVE
NITRITE: NEGATIVE
PROTEIN: NEGATIVE mg/dL
Specific Gravity, Urine: 1.02 (ref 1.005–1.030)
pH: 6 (ref 5.0–8.0)

## 2015-08-02 MED ORDER — AMOXICILLIN-POT CLAVULANATE 875-125 MG PO TABS: 1.0000 | ORAL_TABLET | Freq: Two times a day (BID) | ORAL | Status: DC

## 2015-08-02 MED ORDER — ONDANSETRON HCL 4 MG/2ML IJ SOLN
4.0000 mg | Freq: Once | INTRAMUSCULAR | Status: AC
  Administered 2015-08-02: 4 mg via INTRAVENOUS
  Filled 2015-08-02: qty 2

## 2015-08-02 MED ORDER — HYDROCODONE-ACETAMINOPHEN 5-325 MG PO TABS
1.0000 | ORAL_TABLET | Freq: Once | ORAL | Status: AC
  Administered 2015-08-02: 1 via ORAL
  Filled 2015-08-02: qty 1

## 2015-08-02 MED ORDER — MORPHINE SULFATE (PF) 4 MG/ML IV SOLN
4.0000 mg | Freq: Once | INTRAVENOUS | Status: AC
  Administered 2015-08-02: 4 mg via INTRAVENOUS
  Filled 2015-08-02: qty 1

## 2015-08-02 MED ORDER — IOPAMIDOL (ISOVUE-300) INJECTION 61%
100.0000 mL | Freq: Once | INTRAVENOUS | Status: AC | PRN
  Administered 2015-08-02: 100 mL via INTRAVENOUS

## 2015-08-02 MED ORDER — SODIUM CHLORIDE 0.9% FLUSH
INTRAVENOUS | Status: AC
  Filled 2015-08-02: qty 30

## 2015-08-02 MED ORDER — DIATRIZOATE MEGLUMINE & SODIUM 66-10 % PO SOLN
ORAL | Status: AC
  Filled 2015-08-02: qty 30

## 2015-08-02 NOTE — ED Provider Notes (Signed)
CSN: [id number]     Arrival date & time 08/02/15  0321 History   First MD Initiated Contact with Patient 08/02/15 0350     Chief Complaint  Patient presents with  . Inguinal Hernia     (Consider location/radiation/quality/duration/timing/severity/associated sxs/prior Treatment) HPI Comments: Patient states he's had a left inguinal hernia for at least 6 months that is getting worse. Reports pain with palpation that comes and goes. States the hernia bulges out occasionally as he does lifting at work. He came in today because the pain is too bad. He denies any nausea or vomiting. He's been moving his bowels normally. He denies any obstipation. Denies any fever. History of  right inguinal hernia repair in 2014. He has not seen a Careers adviser since. he states he is always able to reduce his hernia himself but sometimes it comes out when he stands up.  No testicular pain. No blood in the urine.   The history is provided by the patient.    History reviewed. No pertinent past medical history. Past Surgical History  Procedure Laterality Date  . Shoulder surgery    . Arm surgery  [facility]  . Vasectomy  [address]  . Inguinal hernia repair  03/05/2012    Procedure: HERNIA REPAIR INGUINAL ADULT;  Surgeon: Dalia Heading, MD;  Location: [facility];  Service: General;  Laterality: Right;  . Insertion of mesh  03/05/2012    Procedure: INSERTION OF MESH;  Surgeon: Dalia Heading, MD;  Location: [facility];  Service: General;  Laterality: Right;   History reviewed. No pertinent family history. Social History  Substance Use Topics  . Smoking status: Current Every Day Smoker -- 1.00 packs/day for 20 years    Types: Cigarettes  . Smokeless tobacco: None  . Alcohol Use: No    Review of Systems  Constitutional: Negative for fever, activity change, appetite change and fatigue.  HENT: Negative for congestion.   Respiratory: Negative for cough, chest tightness and shortness of breath.    Cardiovascular: Negative for chest pain.  Gastrointestinal: Positive for abdominal pain. Negative for nausea, vomiting and diarrhea.  Genitourinary: Negative for dysuria, hematuria and testicular pain.  Musculoskeletal: Negative for myalgias, arthralgias and neck pain.  Neurological: Negative for dizziness, weakness, numbness and headaches.  A complete 10 system review of systems was obtained and all systems are negative except as noted in the HPI and PMH.      Allergies  Review of patient's allergies indicates no known allergies.  Home Medications   Prior to Admission medications   Medication Sig Start Date End Date Taking? Authorizing Provider  amoxicillin-clavulanate (AUGMENTIN) 875-125 MG tablet Take 1 tablet by mouth every 12 (twelve) hours. 08/02/15   Glynn Octave, MD   BP 103/61 mmHg  Pulse 78  Temp(Src) 97.8 F (36.6 C) (Oral)  Resp 18  Ht  (1.854 m)  Wt 210 lb (95.255 kg)  BMI 27.71 kg/m2  SpO2 98% Physical Exam  Constitutional: He is oriented to person, place, and time. He appears well-developed and well-nourished. No distress.  HENT:  Head: Normocephalic and atraumatic.  Mouth/Throat: Oropharynx is clear and moist. No oropharyngeal exudate.  Eyes: Conjunctivae and EOM are normal. Pupils are equal, round, and reactive to light.  Neck: Normal range of motion. Neck supple.  No meningismus.  Cardiovascular: Normal rate, regular rhythm, normal heart sounds and intact distal pulses.   No murmur heard. Pulmonary/Chest: Effort normal and breath sounds normal.  No respiratory distress.  Abdominal: Soft. There is tenderness. There is no rebound and no guarding.  TTP L inguinal region. No obvious hernia.   Genitourinary:  Testicles nontender  Musculoskeletal: Normal range of motion. He exhibits no edema or tenderness.  Neurological: He is alert and oriented to person, place, and time. No cranial nerve deficit. He exhibits normal muscle tone. Coordination normal.   No ataxia on finger to nose bilaterally. No pronator drift. 5/5 strength throughout. CN 2-12 intact.Equal grip strength. Sensation intact.   Skin: Skin is warm.  Psychiatric: He has a normal mood and affect. His behavior is normal.  Nursing note and vitals reviewed.   ED Course  Procedures (including critical care time) Labs Review Labs Reviewed  CBC WITH DIFFERENTIAL/PLATELET - Abnormal; Notable for the following:    WBC 12.9 (*)    Lymphs Abs 4.7 (*)    All other components within normal limits  COMPREHENSIVE METABOLIC PANEL  URINALYSIS, ROUTINE W REFLEX MICROSCOPIC (NOT [facility])    Imaging Review Ct Abdomen Pelvis W Contrast  08/02/2015  CLINICAL DATA:  Left lower quadrant sharp intermittent abdominal pain EXAM: CT ABDOMEN AND PELVIS WITH CONTRAST TECHNIQUE: Multidetector CT imaging of the abdomen and pelvis was performed using the standard protocol following bolus administration of intravenous contrast. CONTRAST:  100mL ISOVUE-300 IOPAMIDOL (ISOVUE-300) INJECTION 61% COMPARISON:  03/01/2012 FINDINGS: Lower chest and abdominal wall: Fatty left inguinal hernia, also seen previously. Bowel containing right inguinal hernia on the previous study has been repaired. Mild atelectasis at the right base. Hepatobiliary: No focal liver abnormality.No evidence of biliary obstruction or stone. Pancreas: Unremarkable. Spleen: Unremarkable. Adrenals/Urinary Tract: Negative adrenals. No hydronephrosis or stone. Prominent bladder wall thickness but underdistended. Reproductive:No pathologic findings. Stomach/Bowel: Colonic diverticulosis with active inflammation around a thickened distal transverse diverticulum. No abscess or perforation. Diverticulosis is extensive for age. No bowel obstruction or appendicitis. Vascular/Lymphatic: No acute vascular abnormality. No mass or adenopathy. Peritoneal: No ascites or pneumoperitoneum. Musculoskeletal: No acute finding. Remote obturator ring fractures with  heterotopic ossification extending from the symphysis pubis. Disc degeneration and facet arthropathy. 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 IMPRESSION: 1. Uncomplicated transverse diverticulitis. 2. Fatty left inguinal hernia. No recurrence of repaired right inguinal hernia. Electronically Signed   By: Marnee SpringJonathon  Watts M.D.   On: 08/02/2015 06:50   I have personally reviewed and evaluated these images and lab results as part of my medical decision-making.   EKG Interpretation None      MDM   Final diagnoses:  Left inguinal hernia  Diverticulitis of large intestine without perforation or abscess without bleeding   Left inguinal pain with history of hernia. There is no obvious hernia on exam at this time. No testicular pain.  WBC 12.9. UA negative  CT shows incidental Transverse diverticulitis. He will hernia on left contains fat only. No evidence of bowel obstruction.  Follow-up with surgery for elective repair of hernia. Treat diverticulitis. Return precautions discussed.   Glynn OctaveStephen Cletis Clack, MD 08/02/15 [phone]

## 2015-08-02 NOTE — Discharge Instructions (Signed)
Hernia, Adult Take the antibiotics as prescribed. Follow up with Dr. Lovell SheehanJenkins. Return to the ED if you develop new or worsening symptoms. A hernia is the bulging of an organ or tissue through a weak spot in the muscles of the abdomen (abdominal wall). Hernias develop most often near the navel or groin. There are many kinds of hernias. Common kinds include:  Femoral hernia. This kind of hernia develops under the groin in the upper thigh area.  Inguinal hernia. This kind of hernia develops in the groin or scrotum.  Umbilical hernia. This kind of hernia develops near the navel.  Hiatal hernia. This kind of hernia causes part of the stomach to be pushed up into the chest.  Incisional hernia. This kind of hernia bulges through a scar from an abdominal surgery. CAUSES This condition may be caused by:  Heavy lifting.  Coughing over a long period of time.  Straining to have a bowel movement.  An incision made during an abdominal surgery.  A birth defect (congenital defect).  Excess weight or obesity.  Smoking.  Poor nutrition.  Cystic fibrosis.  Excess fluid in the abdomen.  Undescended testicles. SYMPTOMS Symptoms of a hernia include:  A lump on the abdomen. This is the first sign of a hernia. The lump may become more obvious with standing, straining, or coughing. It may get bigger over time if it is not treated or if the condition causing it is not treated.  Pain. A hernia is usually painless, but it may become painful over time if treatment is delayed. The pain is usually dull and may get worse with standing or lifting heavy objects. Sometimes a hernia gets tightly squeezed in the weak spot (strangulated) or stuck there (incarcerated) and causes additional symptoms. These symptoms may include:  Vomiting.  Nausea.  Constipation.  Irritability. DIAGNOSIS A hernia may be diagnosed with:  A physical exam. During the exam your health care provider may ask you to cough or  to make a specific movement, because a hernia is usually more visible when you move.  Imaging tests. These can include:  X-rays.  Ultrasound.  CT scan. TREATMENT A hernia that is small and painless may not need to be treated. A hernia that is large or painful may be treated with surgery. Inguinal hernias may be treated with surgery to prevent incarceration or strangulation. Strangulated hernias are always treated with surgery, because lack of blood to the trapped organ or tissue can cause it to die. Surgery to treat a hernia involves pushing the bulge back into place and repairing the weak part of the abdomen. HOME CARE INSTRUCTIONS  Avoid straining.  Do not lift anything heavier than 10 lb (4.5 kg).  Lift with your leg muscles, not your back muscles. This helps avoid strain.  When coughing, try to cough gently.  Prevent constipation. Constipation leads to straining with bowel movements, which can make a hernia worse or cause a hernia repair to break down. You can prevent constipation by:  Eating a high-fiber diet that includes plenty of fruits and vegetables.  Drinking enough fluids to keep your urine clear or pale yellow. Aim to drink 6-8 glasses of water per day.  Using a stool softener as directed by your health care provider.  Lose weight, if you are overweight.  Do not use any tobacco products, including cigarettes, chewing tobacco, or electronic cigarettes. If you need help quitting, ask your health care provider.  Keep all follow-up visits as directed by your health care  provider. This is important. Your health care provider may need to monitor your condition. SEEK MEDICAL CARE IF:  You have swelling, redness, and pain in the affected area.  Your bowel habits change. SEEK IMMEDIATE MEDICAL CARE IF:  You have a fever.  You have abdominal pain that is getting worse.  You feel nauseous or you vomit.  You cannot push the hernia back in place by gently pressing on  it while you are lying down.  The hernia:  Changes in shape or size.  Is stuck outside the abdomen.  Becomes discolored.  Feels hard or tender.   This information is not intended to replace advice given to you by your health care provider. Make sure you discuss any questions you have with your health care provider.   Document Released: 02/14/2005 Document Revised: 03/07/2014 Document Reviewed: 12/25/2013 Elsevier Interactive Patient Education 2016 [employer].   Diverticulitis Diverticulitis is inflammation or infection of small pouches in your colon that form when you have a condition called diverticulosis. The pouches in your colon are called diverticula. Your colon, or large intestine, is where water is absorbed and stool is formed. Complications of diverticulitis can include:  Bleeding.  Severe infection.  Severe pain.  Perforation of your colon.  Obstruction of your colon. CAUSES  Diverticulitis is caused by bacteria. Diverticulitis happens when stool becomes trapped in diverticula. This allows bacteria to grow in the diverticula, which can lead to inflammation and infection. RISK FACTORS People with diverticulosis are at risk for diverticulitis. Eating a diet that does not include enough fiber from fruits and vegetables may make diverticulitis more likely to develop. SYMPTOMS  Symptoms of diverticulitis may include:  Abdominal pain and tenderness. The pain is normally located on the left side of the abdomen, but may occur in other areas.  Fever and chills.  Bloating.  Cramping.  Nausea.  Vomiting.  Constipation.  Diarrhea.  Blood in your stool. DIAGNOSIS  Your health care provider will ask you about your medical history and do a physical exam. You may need to have tests done because many medical conditions can cause the same symptoms as diverticulitis. Tests may include:  Blood tests.  Urine tests.  Imaging tests of the abdomen, including X-rays  and CT scans. When your condition is under control, your health care provider may recommend that you have a colonoscopy. A colonoscopy can show how severe your diverticula are and whether something else is causing your symptoms. TREATMENT  Most cases of diverticulitis are mild and can be treated at home. Treatment may include:  Taking over-the-counter pain medicines.  Following a clear liquid diet.  Taking antibiotic medicines by mouth for 7-10 days. More severe cases may be treated at a hospital. Treatment may include:  Not eating or drinking.  Taking prescription pain medicine.  Receiving antibiotic medicines through an IV tube.  Receiving fluids and nutrition through an IV tube.  Surgery. HOME CARE INSTRUCTIONS   Follow your health care provider's instructions carefully.  Follow a full liquid diet or other diet as directed by your health care provider. After your symptoms improve, your health care provider may tell you to change your diet. He or she may recommend you eat a high-fiber diet. Fruits and vegetables are good sources of fiber. Fiber makes it easier to pass stool.  Take fiber supplements or probiotics as directed by your health care provider.  Only take medicines as directed by your health care provider.  Keep all your follow-up appointments. SEEK MEDICAL  CARE IF:   Your pain does not improve.  You have a hard time eating food.  Your bowel movements do not return to normal. SEEK IMMEDIATE MEDICAL CARE IF:   Your pain becomes worse.  Your symptoms do not get better.  Your symptoms suddenly get worse.  You have a fever.  You have repeated vomiting.  You have bloody or black, tarry stools. MAKE SURE YOU:   Understand these instructions.  Will watch your condition.  Will get help right away if you are not doing well or get worse.   This information is not intended to replace advice given to you by your health care provider. Make sure you discuss  any questions you have with your health care provider.   Document Released: 11/24/2004 Document Revised: 02/19/2013 Document Reviewed: 01/09/2013 Elsevier Interactive Patient Education [employer]

## 2015-08-02 NOTE — ED Notes (Signed)
Patient being discharged, reports pain at a 6/10. EDP made aware.

## 2015-08-02 NOTE — ED Notes (Addendum)
Pt c/o left sided inguinal hernia x 6 months. Pt states he just "can't stand it anymore." Pt has had a previous right sided inguinal hernia repair.

## 2015-08-07 ENCOUNTER — Encounter (HOSPITAL_COMMUNITY): Payer: Self-pay

## 2015-08-10 ENCOUNTER — Encounter (HOSPITAL_COMMUNITY)
Admission: RE | Admit: 2015-08-10 | Discharge: 2015-08-10 | Disposition: A | Discharge status: Home / Self Care | Payer: BLUE CROSS/BLUE SHIELD | Source: Ambulatory Visit | Attending: Surgery | Admitting: Surgery

## 2015-08-10 HISTORY — DX: Diverticulitis of intestine, part unspecified, without perforation or abscess without bleeding: K57.92

## 2015-08-11 NOTE — H&P (Signed)
Vital Signs:  Vitals as of: 08/06/2015: Systolic 136: Diastolic 81: Heart Rate 80: Temp 98.34F (Temporal): Height 586ft 1in: Weight 200Lbs 0 Ounces: Pain Level 6: BMI 26.39   BMI : 26.39 kg/m2  Subjective:op This 40 year old male presents forLeft groin pain. He states that he had some mild Left groin discomfort at the time of his open Right inguinal hernia repair with mesh a couple of years ago, but the Right side bothered him much more at that time. The Right groin has not bothered patient since being repaired. Over the past few months, it's been the Left groin that has been hurting him increasingly. Sometimes, when it hurts most, he reports being unable to manually reduce the hearnia, but he states that he then falls asleep and consistently finds it auto-reduced when he awkaes. Patient denies any change in bowel habits, abdominal pain otherwise, nausea, vomiting, constipation, or fever/chills.  Review of Symptoms:  Constitutional:No fevers, chills, or unexplained weight loss Head:Atraumatic; no masses; no abnormalities Eyes:No visual changes or eye pain Nose/Mouth/Throat:No nasal congestion, rhinorrhea, oral lesions, postnasal drip or sore throat  Cardiovascular: No chest pain or palpitations.  Respiratory:No cough, shortness of breath or wheezing  Gastrointestin Left groin pain as per HPI. No diarrhea, constipation, blood in stools, abdominal pain, vomiting or heartburn Genitourinary:No urinary frequency, hematuria, incontinence, or dysuria Musculoskeletal:No arthalgias, myalgias or joint swelling Skin:No rash or bothersome skin lesions Breast:No lumps or nipple discharge Hematolgic/Lymphatic:No easy bruising, easy bleeding or swollen glands Allergic/Immunologic:No itching, sneezing , watery eyes, clear rhinorrhea or recurrent infections   Past Medical History:Reviewed  Past Medical History  Surgical History:  Open Right inguinal hernia repair with mesh, vasectomy, shoulder  and forearm surgeries Medical Problems:  Recent transverse colonic diverticulitis   Social History:Reviewed  Social History  Preferred Language: English Race:  White Ethnicity: Not Hispanic / Latino Age: 40 year Marital Status:  S   Smoking Status: Current 1 ppd Functional Status reviewed on 08/05/2015 ------------------------------------------------ Bathing: Normal Cooking: Normal Dressing: Normal Driving: Normal Eating: Normal Managing Meds: Normal Oral Care: Normal Shopping: Normal Toileting: Normal Transferring: Normal Walking: Normal Cognitive Status reviewed on 08/05/2015 ------------------------------------------------ Attention: Normal Decision Making: Normal Language: Normal Memory: Normal Motor: Normal Perception: Normal Problem Solving: Normal Visual and Spatial: Normal   Family History:Reviewed  Family Health History      None recorded.    Objective Information: General:Well appearing, well nourished in no distress. Skin:no rash or prominent lesions Head:Atraumatic; no masses; no abnormalities Eyes:conjunctiva clear, EOM intact, PERRL Mouth:Mucous membranes moist, no mucosal lesions. Throat:no erythema, exudates or lesions. Neck:Supple without lymphadenopathy.  Heart:RRR, no murmur Lungs:CTA bilaterally, no wheezes, rhonchi, rales.  Breathing unlabored. Abdomen:Soft, NT/ND, no HSM, no masses. Left inguinal region tender with palpation and easily reducible Left inguinal hernia (appears to be indirect) Rectal: Extremities:No deformities, clubbing, cyanosis, or edema  Assessment: Diagnoses: 550.90  K40.90 Inguinal hernia (Unilateral inguinal hernia, without obstruction or gangrene, not specified as recurrent) 562.11  K57.32 Diverticulitis of colon (Diverticulitis of large intestine without perforation or abscess without bleeding)  Procedures: [id number] - OFFICE OUTPATIENT NEW 30 MINUTES  Plan: Plan for elective open repair  of reducible symptomatic Left inguinal hernia with mesh next Wednesday, 08/12/2015. Complete course of PO antibiotics prescribed in ED for uncomplicated diverticulitis of transverse colon, plan for future colonoscopy.

## 2015-08-12 ENCOUNTER — Encounter (HOSPITAL_COMMUNITY): Payer: Self-pay | Admitting: *Deleted

## 2015-08-12 ENCOUNTER — Ambulatory Visit (HOSPITAL_COMMUNITY)
Admission: RE | Admit: 2015-08-12 | Discharge: 2015-08-12 | Disposition: A | Discharge status: Home / Self Care | Payer: BLUE CROSS/BLUE SHIELD | Source: Ambulatory Visit | Attending: Surgery | Admitting: Surgery

## 2015-08-12 ENCOUNTER — Encounter (HOSPITAL_COMMUNITY)
Admission: RE | Disposition: A | Discharge status: Home / Self Care | Payer: Self-pay | Source: Ambulatory Visit | Attending: Surgery

## 2015-08-12 ENCOUNTER — Ambulatory Visit (HOSPITAL_COMMUNITY): Payer: BLUE CROSS/BLUE SHIELD | Admitting: Anesthesiology

## 2015-08-12 DIAGNOSIS — F17200 Nicotine dependence, unspecified, uncomplicated: Secondary | ICD-10-CM | POA: Insufficient documentation

## 2015-08-12 DIAGNOSIS — K4090 Unilateral inguinal hernia, without obstruction or gangrene, not specified as recurrent: Secondary | ICD-10-CM | POA: Diagnosis not present

## 2015-08-12 HISTORY — PX: INSERTION OF MESH: SHX5868

## 2015-08-12 HISTORY — PX: INGUINAL HERNIA REPAIR: SHX194

## 2015-08-12 SURGERY — REPAIR, HERNIA, INGUINAL, ADULT
Anesthesia: General | Site: Inguinal | Laterality: Left

## 2015-08-12 MED ORDER — BUPIVACAINE LIPOSOME 1.3 % IJ SUSP
INTRAMUSCULAR | Status: AC
  Filled 2015-08-12: qty 20

## 2015-08-12 MED ORDER — FENTANYL CITRATE (PF) 100 MCG/2ML IJ SOLN
INTRAMUSCULAR | Status: DC | PRN
  Administered 2015-08-12 (×3): 50 ug via INTRAVENOUS
  Administered 2015-08-12: 100 ug via INTRAVENOUS
  Administered 2015-08-12 (×3): 50 ug via INTRAVENOUS

## 2015-08-12 MED ORDER — FENTANYL CITRATE (PF) 100 MCG/2ML IJ SOLN
INTRAMUSCULAR | Status: AC
  Filled 2015-08-12: qty 2

## 2015-08-12 MED ORDER — ONDANSETRON HCL 4 MG/2ML IJ SOLN
4.0000 mg | Freq: Once | INTRAMUSCULAR | Status: AC
  Administered 2015-08-12: 4 mg via INTRAVENOUS

## 2015-08-12 MED ORDER — MIDAZOLAM HCL 2 MG/2ML IJ SOLN
1.0000 mg | INTRAMUSCULAR | Status: DC | PRN
  Administered 2015-08-12 (×2): 2 mg via INTRAVENOUS

## 2015-08-12 MED ORDER — CEFAZOLIN SODIUM-DEXTROSE 2-4 GM/100ML-% IV SOLN
2.0000 g | INTRAVENOUS | Status: AC
  Administered 2015-08-12: 2 g via INTRAVENOUS
  Filled 2015-08-12: qty 100

## 2015-08-12 MED ORDER — OXYCODONE-ACETAMINOPHEN 5-325 MG PO TABS: 1.0000 | ORAL_TABLET | ORAL | Status: DC | PRN

## 2015-08-12 MED ORDER — BUPIVACAINE HCL (PF) 0.5 % IJ SOLN
INTRAMUSCULAR | Status: DC | PRN
  Administered 2015-08-12: 4 mL

## 2015-08-12 MED ORDER — FENTANYL CITRATE (PF) 100 MCG/2ML IJ SOLN
INTRAMUSCULAR | Status: AC
  Filled 2015-08-12: qty 4

## 2015-08-12 MED ORDER — PROPOFOL 10 MG/ML IV BOLUS
INTRAVENOUS | Status: AC
  Filled 2015-08-12: qty 20

## 2015-08-12 MED ORDER — FENTANYL CITRATE (PF) 100 MCG/2ML IJ SOLN
25.0000 ug | INTRAMUSCULAR | Status: DC | PRN
  Administered 2015-08-12: 50 ug via INTRAVENOUS
  Administered 2015-08-12 (×2): 25 ug via INTRAVENOUS
  Administered 2015-08-12: 50 ug via INTRAVENOUS
  Filled 2015-08-12 (×2): qty 2

## 2015-08-12 MED ORDER — BUPIVACAINE LIPOSOME 1.3 % IJ SUSP
INTRAMUSCULAR | Status: DC | PRN
  Administered 2015-08-12: 20 mL

## 2015-08-12 MED ORDER — MIDAZOLAM HCL 2 MG/2ML IJ SOLN
INTRAMUSCULAR | Status: AC
  Filled 2015-08-12: qty 4

## 2015-08-12 MED ORDER — LIDOCAINE HCL 1 % IJ SOLN
INTRAMUSCULAR | Status: DC | PRN
  Administered 2015-08-12: 4 mL

## 2015-08-12 MED ORDER — LIDOCAINE HCL 1 % IJ SOLN
INTRAMUSCULAR | Status: DC | PRN
  Administered 2015-08-12: 30 mg via INTRADERMAL

## 2015-08-12 MED ORDER — SEVOFLURANE IN SOLN
RESPIRATORY_TRACT | Status: AC
  Filled 2015-08-12: qty 250

## 2015-08-12 MED ORDER — CHLORHEXIDINE GLUCONATE 4 % EX LIQD: 60.0000 mL | Freq: Once | CUTANEOUS | Status: DC

## 2015-08-12 MED ORDER — FENTANYL CITRATE (PF) 100 MCG/2ML IJ SOLN
25.0000 ug | INTRAMUSCULAR | Status: AC
  Administered 2015-08-12 (×2): 25 ug via INTRAVENOUS

## 2015-08-12 MED ORDER — BUPIVACAINE HCL (PF) 0.5 % IJ SOLN
INTRAMUSCULAR | Status: AC
  Filled 2015-08-12: qty 30

## 2015-08-12 MED ORDER — LIDOCAINE HCL (PF) 1 % IJ SOLN
INTRAMUSCULAR | Status: AC
  Filled 2015-08-12: qty 5

## 2015-08-12 MED ORDER — SODIUM CHLORIDE 0.9 % IR SOLN
Status: DC | PRN
  Administered 2015-08-12: 1000 mL

## 2015-08-12 MED ORDER — MIDAZOLAM HCL 2 MG/2ML IJ SOLN
INTRAMUSCULAR | Status: AC
  Filled 2015-08-12: qty 2

## 2015-08-12 MED ORDER — MIDAZOLAM HCL 5 MG/5ML IJ SOLN
INTRAMUSCULAR | Status: DC | PRN
  Administered 2015-08-12: 2 mg via INTRAVENOUS

## 2015-08-12 MED ORDER — LACTATED RINGERS IV SOLN
INTRAVENOUS | Status: DC
  Administered 2015-08-12: 12:00:00 via INTRAVENOUS

## 2015-08-12 MED ORDER — PROPOFOL 10 MG/ML IV BOLUS
INTRAVENOUS | Status: DC | PRN
  Administered 2015-08-12: 200 mg via INTRAVENOUS

## 2015-08-12 MED ORDER — LIDOCAINE HCL (PF) 1 % IJ SOLN
INTRAMUSCULAR | Status: AC
  Filled 2015-08-12: qty 30

## 2015-08-12 MED ORDER — ONDANSETRON HCL 4 MG/2ML IJ SOLN
INTRAMUSCULAR | Status: AC
  Filled 2015-08-12: qty 2

## 2015-08-12 MED ORDER — ONDANSETRON HCL 4 MG/2ML IJ SOLN: 4.0000 mg | Freq: Once | INTRAMUSCULAR | Status: DC | PRN

## 2015-08-12 SURGICAL SUPPLY — 45 items
BAG HAMPER (MISCELLANEOUS) ×4 IMPLANT
BLADE SURG SZ10 CARB STEEL (BLADE) IMPLANT
CLOSURE WOUND 1/2 X4 (GAUZE/BANDAGES/DRESSINGS)
CLOTH BEACON ORANGE TIMEOUT ST (SAFETY) ×4 IMPLANT
COVER LIGHT HANDLE STERIS (MISCELLANEOUS) ×8 IMPLANT
DECANTER SPIKE VIAL GLASS SM (MISCELLANEOUS) ×4 IMPLANT
DERMABOND ADVANCED (GAUZE/BANDAGES/DRESSINGS) ×2
DERMABOND ADVANCED .7 DNX12 (GAUZE/BANDAGES/DRESSINGS) ×2 IMPLANT
DRAIN PENROSE 18X.75 LTX STRL (MISCELLANEOUS) ×4 IMPLANT
DURAPREP 26ML APPLICATOR (WOUND CARE) IMPLANT
ELECT REM PT RETURN 9FT ADLT (ELECTROSURGICAL) ×4
ELECTRODE REM PT RTRN 9FT ADLT (ELECTROSURGICAL) ×2 IMPLANT
FORMALIN 10 PREFIL 120ML (MISCELLANEOUS) IMPLANT
GLOVE BIOGEL PI IND STRL 7.0 (GLOVE) ×4 IMPLANT
GLOVE BIOGEL PI IND STRL 7.5 (GLOVE) ×2 IMPLANT
GLOVE BIOGEL PI INDICATOR 7.0 (GLOVE) ×4
GLOVE BIOGEL PI INDICATOR 7.5 (GLOVE) ×2
GLOVE ECLIPSE 6.5 STRL STRAW (GLOVE) ×4 IMPLANT
GLOVE ECLIPSE 7.0 STRL STRAW (GLOVE) ×4 IMPLANT
GOWN STRL REUS W/TWL LRG LVL3 (GOWN DISPOSABLE) ×12 IMPLANT
INST SET MINOR GENERAL (KITS) ×4 IMPLANT
KIT ROOM TURNOVER APOR (KITS) ×4 IMPLANT
MANIFOLD NEPTUNE II (INSTRUMENTS) ×4 IMPLANT
MESH HERNIA 1.6X1.9 PLUG LRG (Mesh General) ×2 IMPLANT
MESH HERNIA PLUG LRG (Mesh General) ×2 IMPLANT
MESH MARLEX PLUG MEDIUM (Mesh General) ×4 IMPLANT
NS IRRIG 1000ML POUR BTL (IV SOLUTION) ×4 IMPLANT
PACK MINOR (CUSTOM PROCEDURE TRAY) ×4 IMPLANT
PAD ARMBOARD 7.5X6 YLW CONV (MISCELLANEOUS) ×4 IMPLANT
SET BASIN LINEN APH (SET/KITS/TRAYS/PACK) ×4 IMPLANT
STRIP CLOSURE SKIN 1/2X4 (GAUZE/BANDAGES/DRESSINGS) IMPLANT
SUT ETHIBOND NAB MO 7 #0 18IN (SUTURE) ×8 IMPLANT
SUT MNCRL AB 4-0 PS2 18 (SUTURE) ×4 IMPLANT
SUT SILK 2 0 (SUTURE)
SUT SILK 2-0 18XBRD TIE 12 (SUTURE) IMPLANT
SUT SILK 3 0 (SUTURE) ×2
SUT SILK 3-0 18XBRD TIE 12 (SUTURE) ×2 IMPLANT
SUT VIC AB 2-0 CT1 27 (SUTURE) ×2
SUT VIC AB 2-0 CT1 TAPERPNT 27 (SUTURE) ×2 IMPLANT
SUT VIC AB 3-0 SH 27 (SUTURE) ×2
SUT VIC AB 3-0 SH 27X BRD (SUTURE) ×2 IMPLANT
SUT VICRYL AB 3 0 TIES (SUTURE) ×4 IMPLANT
SYR BULB IRRIGATION 50ML (SYRINGE) ×4 IMPLANT
SYR CONTROL 10ML LL (SYRINGE) ×4 IMPLANT
SYRINGE 20CC LL (MISCELLANEOUS) ×4 IMPLANT

## 2015-08-12 NOTE — Anesthesia Postprocedure Evaluation (Signed)
Anesthesia Post Note  Patient: Lorelei PontJason E Mahajan  Procedure(s) Performed: Procedure(s) (LRB): HERNIA REPAIR LEFT INGUINAL ADULT WITH MESH (Left)  Patient location during evaluation: PACU Anesthesia Type: General Level of consciousness: confused Pain management: pain level controlled Vital Signs Assessment: post-procedure vital signs reviewed and stable Respiratory status: spontaneous breathing, nonlabored ventilation and respiratory function stable Cardiovascular status: blood pressure returned to baseline Postop Assessment: no signs of nausea or vomiting Anesthetic complications: no    Last Vitals:  Filed Vitals:   08/12/15 1140 08/12/15 1145  BP:  110/75  Temp:    Resp: 19 20    Last Pain:  Filed Vitals:   08/12/15 1538  PainSc: 5                  Mikelle Myrick J

## 2015-08-12 NOTE — Interval H&P Note (Signed)
History and Physical Interval Note:  08/12/2015 12:39 PM  Jordan PontJason E Croston  has presented today for surgery, with the diagnosis of left inguinal hernia  The various methods of treatment have been discussed with the patient and family. After consideration of risks, benefits and other options for treatment, the patient has consented to  Procedure(s): HERNIA REPAIR INGUINAL ADULT WITH MESH (Left) as a surgical intervention .  The patient's history has been reviewed, patient examined, no change in status, stable for surgery.  I have reviewed the patient's chart and labs.  Questions were answered to the patient's satisfaction.     Ancil LinseyJason Evan Estephan Gallardo

## 2015-08-12 NOTE — Addendum Note (Signed)
Addendum  created 08/12/15 1542 by Despina Hiddenobert J Ilyse Tremain, CRNA   Modules edited: Anesthesia Medication Administration

## 2015-08-12 NOTE — Op Note (Signed)
SURGICAL OPERATIVE REPORT  ATTENDING SURGEON: Surgeon(s): Ancil Linsey, MD  ASSISTANT(S): None  ANESTHESIA: GETA  PRE-OPERATIVE DIAGNOSIS: Symptomatic Left inguinal hernia  POST-OPERATIVE DIAGNOSIS: Symptomatic Left inguinal hernia  PROCEDURE(S):  1.) Repair of symptomatic Left inguinal hernia with mesh  INTRAOPERATIVE FINDINGS: Large Left Direct and Indirect inguinal hernias containing peritoneal fat and some small intestine in the direct hernia sac at the time of surgery  INTRAVENOUS FLUIDS: 800 mL crystalloid   ESTIMATED BLOOD LOSS: Minimal (<20 mL)  URINE OUTPUT: No foley  SPECIMENS: None  IMPLANTS: Bard PerFix mesh plugs x2 and Bard Soft polypropylene mesh patch x1  DRAINS: None   COMPLICATIONS: None apparent   CONDITION AT END OF PROCEDURE: Hemodynamically stable and extubated   DISPOSITION OF PATIENT: PACU  INDICATIONS FOR PROCEDURE:   Patient is a 40 y.o. male who presented with Left groin pain, worsening x several years. On exam, a Left inguinal hernia was identified, contributing factors were assessed, and operative repair was offered. All risks, benefits, and alternatives to above procedure were discussed with the patient, all of patient's questions were answered to his expressed satisfaction, and informed consent was obtained and documented.  DETAILS OF PROCEDURE: Patient was brought to the operating suite and appropriately identified. General anesthesia was administered along with appropriate pre-operative antibiotics, and endotracheal intubation was performed by anesthetist. In supine position, operative site was prepped and draped in the usual sterile fashion, and following a brief time out, local anesthetic was injected over the planned incision site and initial transverse skin incision was made along a natural skin crease using a #15 blade scalpel 1-2 cm superior to the inguinal ligament as determined by identifying and marking the anterior superior  iliac spine and pubic tubercle. This incision was then extended deep through subcutaneous tissues, Scarpa's and Camper's fascia, using electrocautery until external oblique aponeurosis was encountered and the external ring was exposed. A small incision was made in the mid- external oblique aponeurosis in the direction of its fibers, elevated off underlying structures, and extended medially and laterally. The ilioinguinal nerve was identified and protected throughout the remainder of dissection and procedure, and superior and inferior external oblique flaps were developed bluntly.  The spermatic cord was safely encircled with a penrose drain and carefully dissected free from the anteromedial hernia sac, and its contents were reduced. Attention was then directed to the floor of the inguinal canal, through which the hernia sac had been reduced through a well-defined defect in addition to a moderate-sized indirect defect through which a second hernia sac was also reduced. A polypropylene mesh plug was inserted into both the direct and indirect hernia defects, and an appropriately fashioned mesh patch was sutured inferiorly to the shelving edge of the inguinal ligament, starting medially at the pubic tubercle, using interrupted Ethibond suture and inferiorly and interrupted Ethibond suture(s) to the transversalis muscle/fascia superiorly. Care was taken to assure the mesh was placed in a relaxed fashion without excessive tension and without any neurovascular structures incorporated into the repair. Laterally, the tails of the mesh were then crossed to recreate the internal ring. Hemostasis was confirmed, and the incision was closed in multiple layers using a running 2-0 Vicryl suture to re-approximate the external oblique aponeurosis, running 3-0 Vicryl suture to re-approximate Scarpa's fascia, interrupted 3-0 Vicryl deep dermal sutures, and running 4-0 Vicryl subcuticular suture to re-approximate epidermis. The skin  was then cleaned, dried, and sterile skin glue was applied. Patient was then safely able to be extubated, awakened, and transferred  to PACU for post-operative monitoring and care.  I was present for all aspects of the above procedure, and there were no complications apparent.

## 2015-08-12 NOTE — Anesthesia Preprocedure Evaluation (Signed)
Anesthesia Evaluation  Patient identified by MRN, date of birth, ID band Patient awake    Reviewed: Allergy & Precautions, H&P , NPO status , Patient's Chart, lab work & pertinent test results  Airway Mallampati: I  TM Distance: >3 FB     Dental  (+) Teeth Intact   Pulmonary Current Smoker,    breath sounds clear to auscultation       Cardiovascular negative cardio ROS   Rhythm:Regular Rate:Normal     Neuro/Psych    GI/Hepatic negative GI ROS,   Endo/Other    Renal/GU      Musculoskeletal   Abdominal   Peds  Hematology   Anesthesia Other Findings   Reproductive/Obstetrics                             Anesthesia Physical Anesthesia Plan  ASA: I  Anesthesia Plan: General   Post-op Pain Management:    Induction: Intravenous  Airway Management Planned: LMA  Additional Equipment:   Intra-op Plan:   Post-operative Plan: Extubation in OR  Informed Consent: I have reviewed the patients History and Physical, chart, labs and discussed the procedure including the risks, benefits and alternatives for the proposed anesthesia with the patient or authorized representative who has indicated his/her understanding and acceptance.     Plan Discussed with:   Anesthesia Plan Comments:         Anesthesia Quick Evaluation

## 2015-08-12 NOTE — Transfer of Care (Signed)
Immediate Anesthesia Transfer of Care Note  Patient: Jordan Banks  Procedure(s) Performed: Procedure(s): HERNIA REPAIR LEFT INGUINAL ADULT WITH MESH (Left)  Patient Location: PACU  Anesthesia Type:General  Level of Consciousness: awake and patient cooperative  Airway & Oxygen Therapy: Patient Spontanous Breathing and Patient connected to face mask oxygen  Post-op Assessment: Report given to RN, Post -op Vital signs reviewed and stable and Patient moving all extremities  Post vital signs: Reviewed and stable  Last Vitals:  Filed Vitals:   08/12/15 1140 08/12/15 1145  BP:  110/75  Temp:    Resp: 19 20    Last Pain:  Filed Vitals:   08/12/15 1147  PainSc: 5       Patients Stated Pain Goal: 7 (08/12/15 1105)  Complications: No apparent anesthesia complications

## 2015-08-12 NOTE — Discharge Instructions (Signed)
PATIENT INSTRUCTIONS POST-ANESTHESIA  IMMEDIATELY FOLLOWING SURGERY:  Do not drive or operate machinery for the first twenty four hours after surgery.  Do not make any important decisions for twenty four hours after surgery or while taking narcotic pain medications or sedatives.  If you develop intractable nausea and vomiting or a severe headache please notify your doctor immediately.  FOLLOW-UP:  Please make an appointment with your surgeon as instructed. You do not need to follow up with anesthesia unless specifically instructed to do so.  WOUND CARE INSTRUCTIONS (if applicable):  Keep a dry clean dressing on the anesthesia/puncture wound site if there is drainage.  Once the wound has quit draining you may leave it open to air.  Generally you should leave the bandage intact for twenty four hours unless there is drainage.  If the epidural site drains for more than 36-48 hours please call the anesthesia department.  QUESTIONS?:  Please feel free to call your physician or the hospital operator if you have any questions, and they will be happy to assist you.         Open Hernia Repair, Care After Refer to this sheet in the next few weeks. These instructions provide you with information on caring for yourself after your procedure. Your health care provider may also give you more specific instructions. Your treatment has been planned according to current medical practices, but problems sometimes occur. Call your health care provider if you have any problems or questions after your procedure. WHAT TO EXPECT AFTER THE PROCEDURE After your procedure, it is typical to have the following:  Pain in your abdomen, especially along your incision. You will be given pain medicines to control the pain.  Constipation. You may be given a stool softener to help prevent this. HOME CARE INSTRUCTIONS  Only take over-the-counter or prescription medicines as directed by your health care provider.  Keep the  incision area dry and clean. You may wash the incision area gently with soap and water 48 hours after surgery. Gently blot or dab the incision area dry. Do not take baths, use swimming pools, or use hot tubs for 10 days or until your health care provider approves.  Change bandages (dressings) as directed by your health care provider.  Continue your normal diet as directed by your health care provider. Eat plenty of fruits and vegetables to help prevent constipation.  Drink enough fluids to keep your urine clear or pale yellow. This also helps prevent constipation.  Do not drive until your health care provider says it is okay.  Do not lift anything heavier than 10 lb (4.5 kg) or play contact sports for 4 weeks or until your health care provider approves.  Follow up with your health care provider as directed. Ask your health care provider when to make an appointment to have your stitches (sutures) or staples removed. SEEK MEDICAL CARE IF:  You have increased bleeding coming from the incision site.  You have blood in your stool.  You have increasing pain in the incision area.  You see redness or swelling in the incision area.  You have fluid (pus) coming from the incision.  You have a fever.  You notice a bad smell coming from the incision area or dressing. SEEK IMMEDIATE MEDICAL CARE IF:  You develop a rash.  You have chest pain or shortness of breath.  You feel lightheaded or feel faint.   This information is not intended to replace advice given to you by your health care  provider. Make sure you discuss any questions you have with your health care provider.   Document Released: 09/03/2004 Document Revised: 03/07/2014 Document Reviewed: 09/26/2012 Elsevier Interactive Patient Education [employer]

## 2015-08-12 NOTE — Anesthesia Procedure Notes (Signed)
Procedure Name: LMA Insertion Date/Time: 08/12/2015 1:17 PM Performed by: Despina HiddenIDACAVAGE, Anushka Hartinger J Pre-anesthesia Checklist: Patient identified, Emergency Drugs available, Suction available and Patient being monitored Patient Re-evaluated:Patient Re-evaluated prior to inductionOxygen Delivery Method: Circle system utilized Preoxygenation: Pre-oxygenation with 100% oxygen Intubation Type: IV induction Ventilation: Mask ventilation without difficulty LMA: LMA inserted LMA Size: 5.0 Grade View: Grade I Number of attempts: 1 Placement Confirmation: positive ETCO2 and breath sounds checked- equal and bilateral Tube secured with: Tape Dental Injury: Teeth and Oropharynx as per pre-operative assessment

## 2015-08-14 ENCOUNTER — Encounter (HOSPITAL_COMMUNITY): Payer: Self-pay | Admitting: Surgery

## 2015-10-01 ENCOUNTER — Ambulatory Visit (HOSPITAL_COMMUNITY)
Admission: RE | Admit: 2015-10-01 | Discharge: 2015-10-01 | Disposition: A | Discharge status: Home / Self Care | Payer: BLUE CROSS/BLUE SHIELD | Source: Ambulatory Visit | Attending: Registered Nurse | Admitting: Registered Nurse

## 2015-10-01 ENCOUNTER — Other Ambulatory Visit (HOSPITAL_COMMUNITY): Payer: Self-pay | Admitting: Registered Nurse

## 2015-10-01 DIAGNOSIS — M25562 Pain in left knee: Secondary | ICD-10-CM | POA: Insufficient documentation

## 2016-10-22 IMAGING — DX DG KNEE COMPLETE 4+V*L*
4 series · 4 of 4 positions shown · non-contrast
Comparison: None.

CLINICAL DATA: Left knee pain after fall yesterday.

EXAM:
LEFT KNEE - COMPLETE 4+ VIEW

[knee ap]
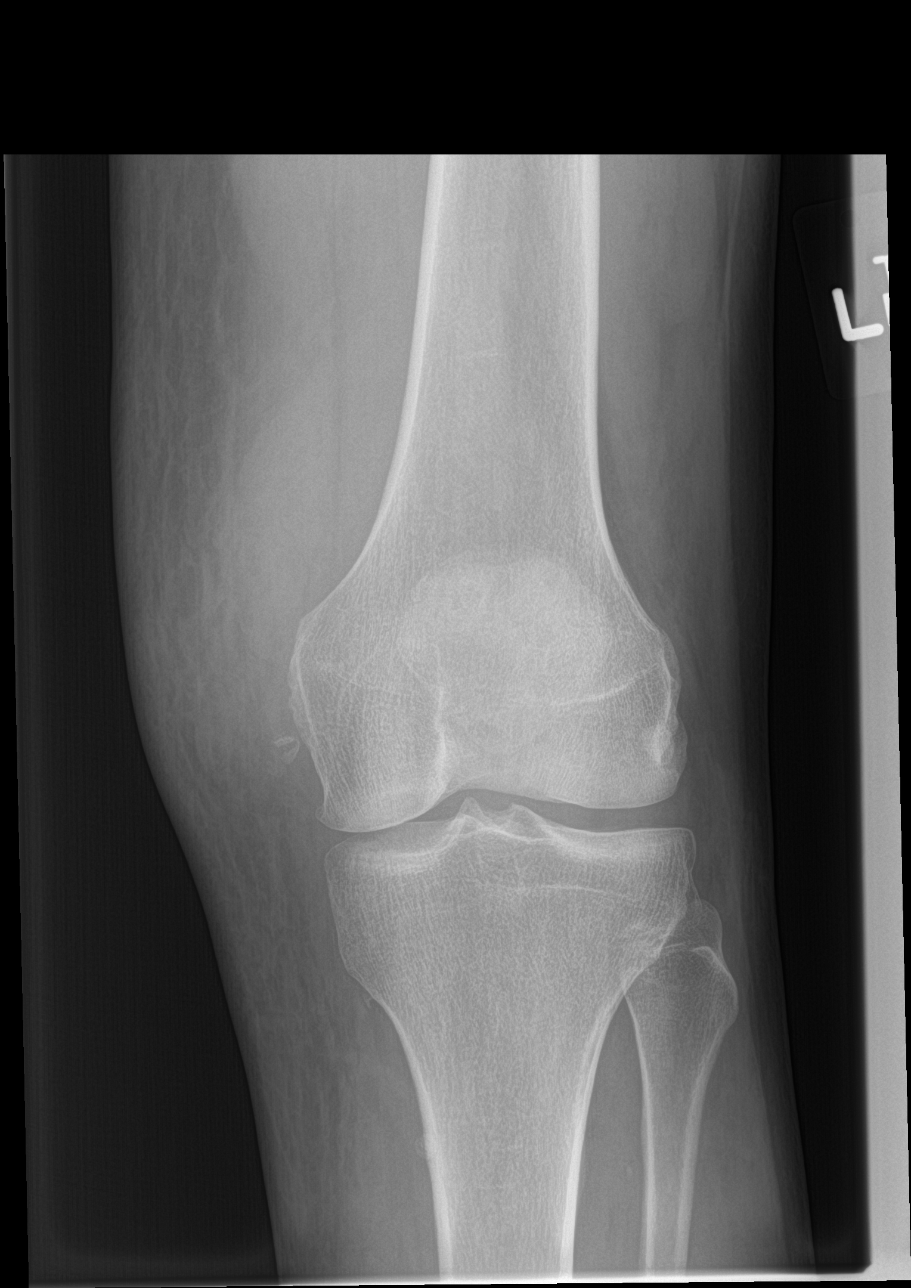

[tunnel]
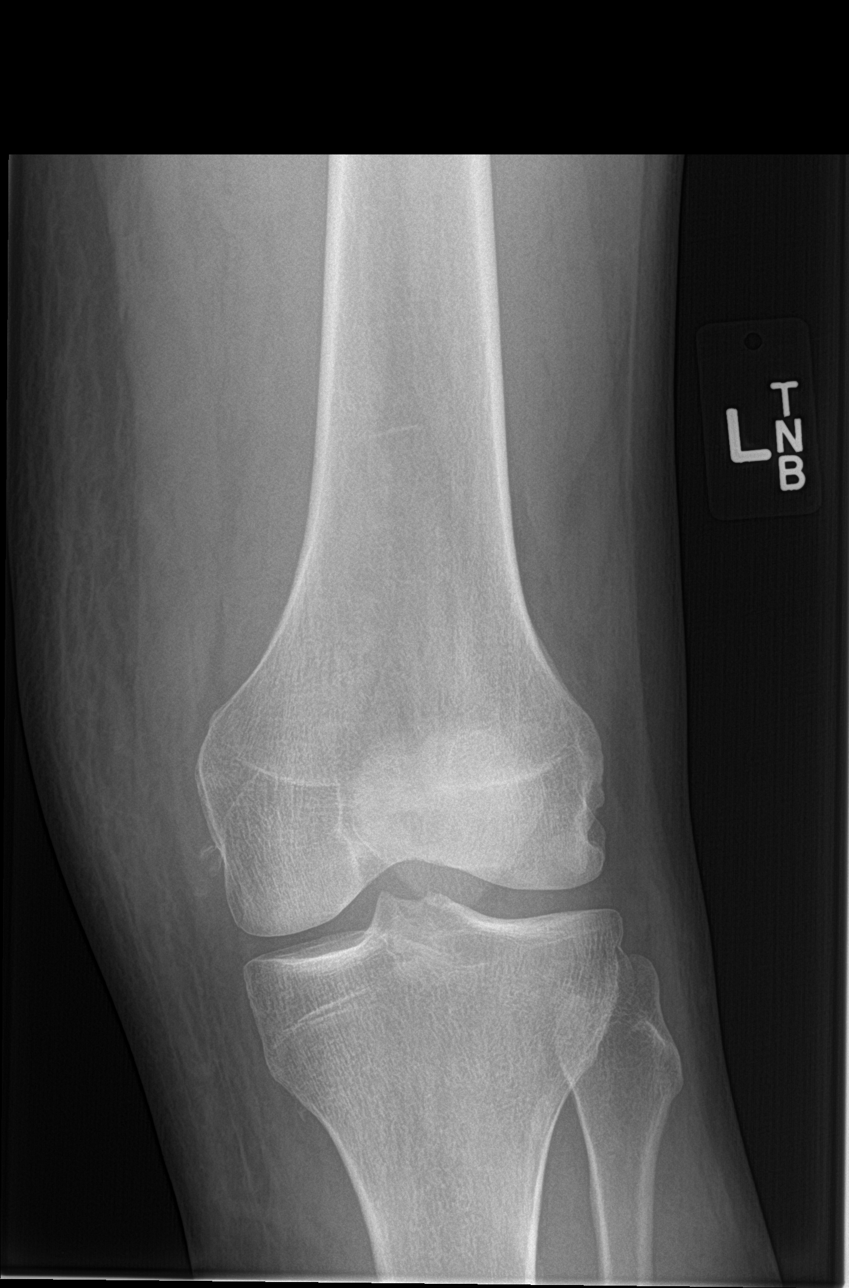

[knee lat]
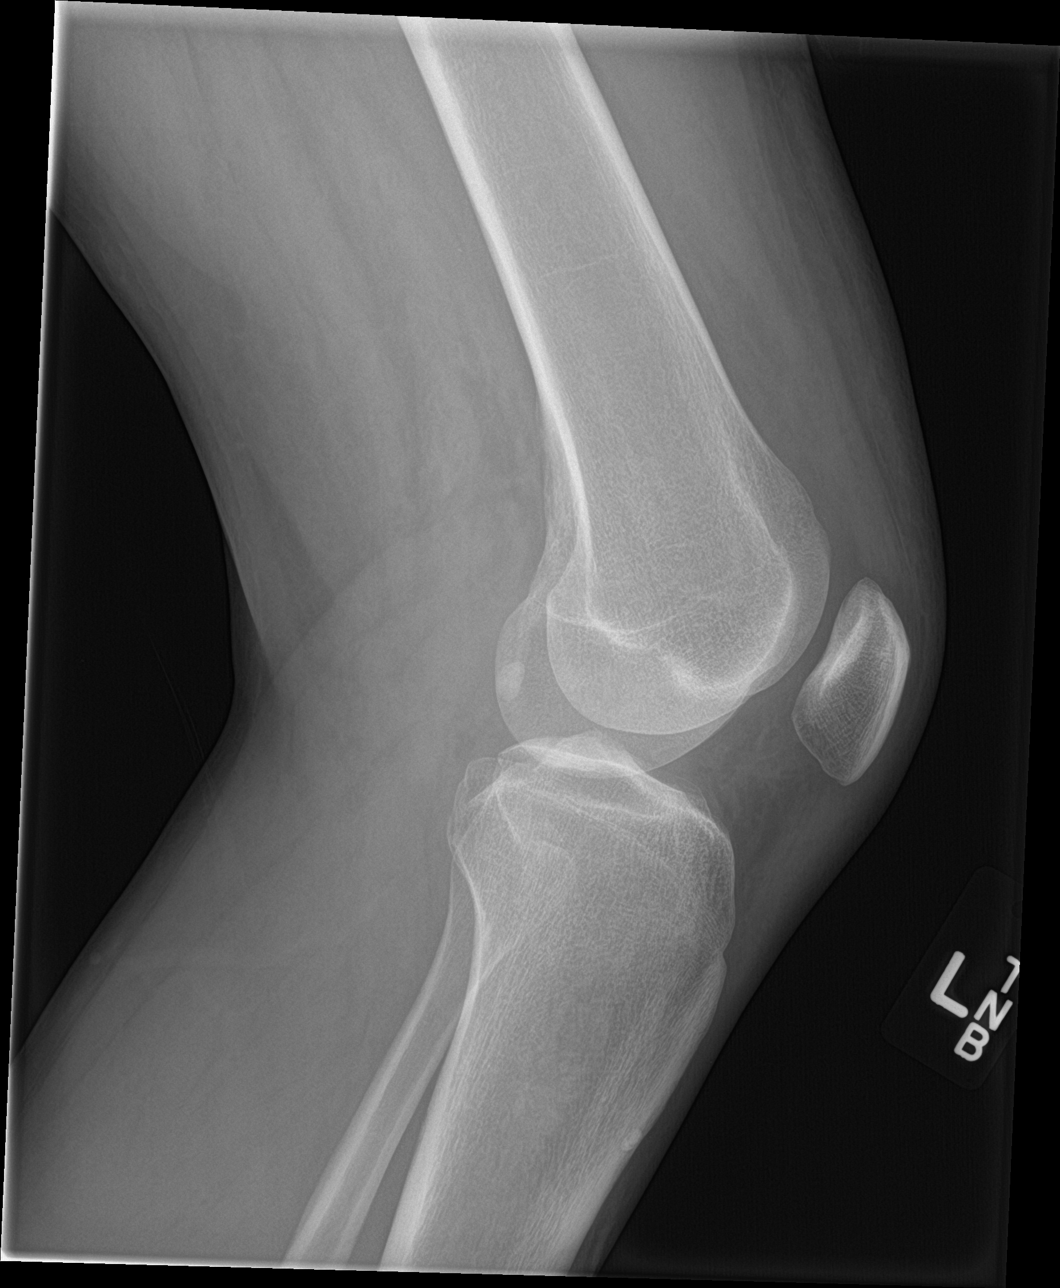

[knee sunrise]
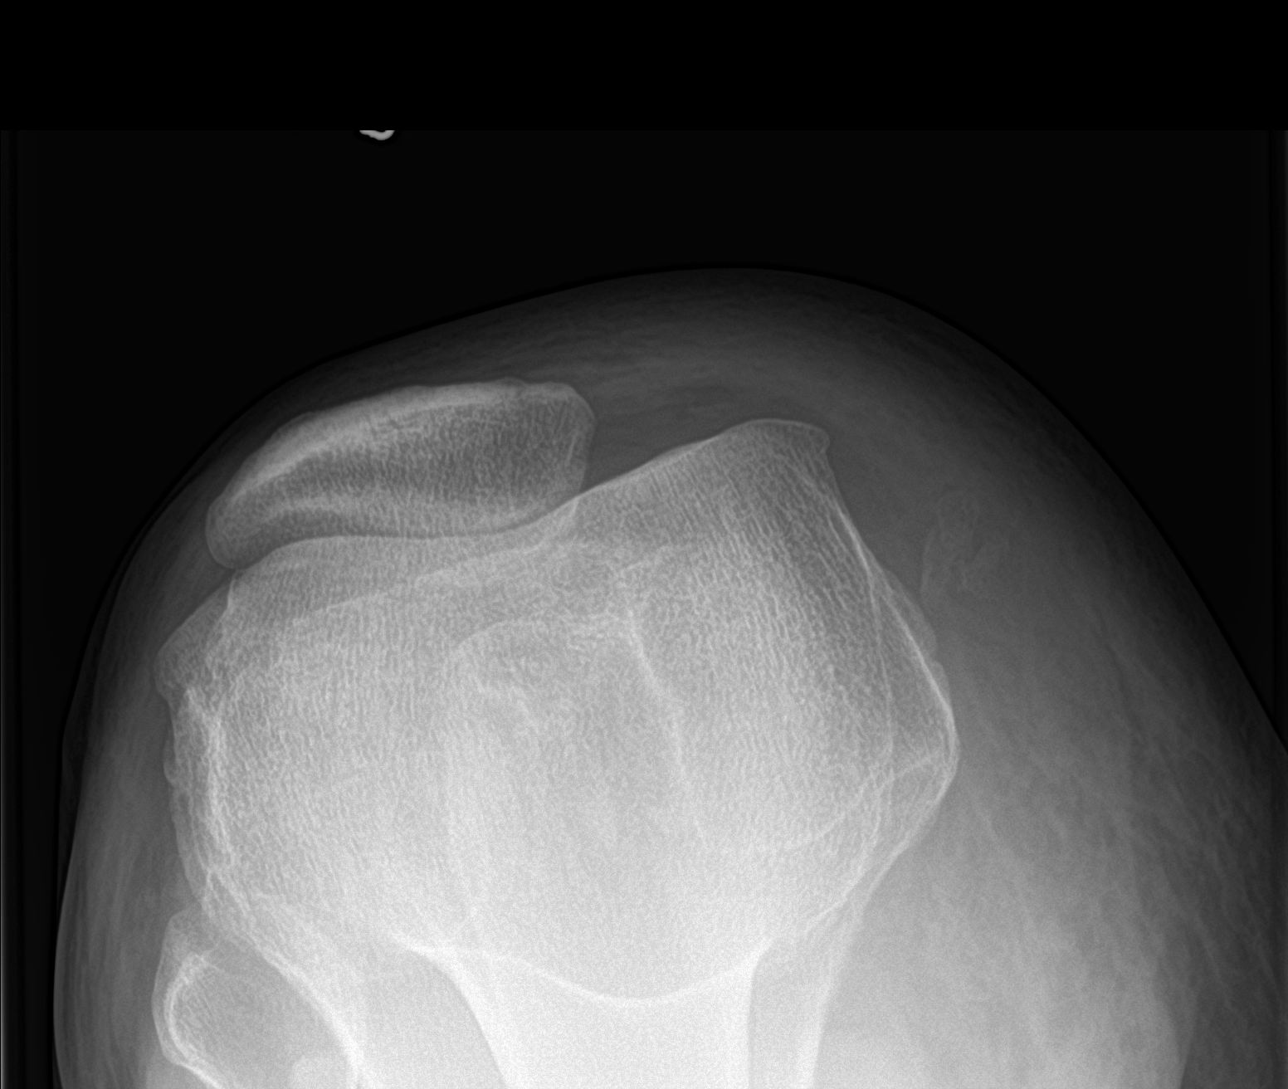

[4 of 4 positions shown; findings below may reference images not displayed]

FINDINGS: No evidence of fracture, dislocation, or joint effusion. No evidence
of arthropathy or other focal bone abnormality. Soft tissues are
unremarkable.
IMPRESSION: Normal left knee.

## 2016-11-02 DIAGNOSIS — C4441 Basal cell carcinoma of skin of scalp and neck: Secondary | ICD-10-CM | POA: Diagnosis not present

## 2016-12-14 DIAGNOSIS — Z08 Encounter for follow-up examination after completed treatment for malignant neoplasm: Secondary | ICD-10-CM | POA: Diagnosis not present

## 2016-12-14 DIAGNOSIS — Z85828 Personal history of other malignant neoplasm of skin: Secondary | ICD-10-CM | POA: Diagnosis not present

## 2018-07-05 DIAGNOSIS — H6991 Unspecified Eustachian tube disorder, right ear: Secondary | ICD-10-CM | POA: Diagnosis not present

## 2018-07-05 DIAGNOSIS — W57XXXA Bitten or stung by nonvenomous insect and other nonvenomous arthropods, initial encounter: Secondary | ICD-10-CM | POA: Diagnosis not present

## 2018-07-05 DIAGNOSIS — E663 Overweight: Secondary | ICD-10-CM | POA: Diagnosis not present

## 2018-07-05 DIAGNOSIS — Z1389 Encounter for screening for other disorder: Secondary | ICD-10-CM | POA: Diagnosis not present

## 2018-07-05 DIAGNOSIS — J0100 Acute maxillary sinusitis, unspecified: Secondary | ICD-10-CM | POA: Diagnosis not present

## 2018-07-05 DIAGNOSIS — H1032 Unspecified acute conjunctivitis, left eye: Secondary | ICD-10-CM | POA: Diagnosis not present

## 2018-07-05 DIAGNOSIS — Z6828 Body mass index (BMI) 28.0-28.9, adult: Secondary | ICD-10-CM | POA: Diagnosis not present

## 2018-07-05 DIAGNOSIS — K469 Unspecified abdominal hernia without obstruction or gangrene: Secondary | ICD-10-CM | POA: Diagnosis not present

## 2018-07-24 ENCOUNTER — Other Ambulatory Visit: Payer: Self-pay

## 2018-07-24 ENCOUNTER — Encounter (INDEPENDENT_AMBULATORY_CARE_PROVIDER_SITE_OTHER): Payer: Self-pay

## 2018-07-24 ENCOUNTER — Encounter: Payer: Self-pay | Admitting: General Surgery

## 2018-07-24 ENCOUNTER — Ambulatory Visit (INDEPENDENT_AMBULATORY_CARE_PROVIDER_SITE_OTHER): Payer: BLUE CROSS/BLUE SHIELD | Admitting: General Surgery

## 2018-07-24 VITALS — BP 108/68 | HR 66 | Temp 98.6°F | Resp 16 | Ht 73.0 in | Wt 211.0 lb

## 2018-07-24 DIAGNOSIS — K439 Ventral hernia without obstruction or gangrene: Secondary | ICD-10-CM | POA: Diagnosis not present

## 2018-07-24 NOTE — Patient Instructions (Signed)
Ventral Hernia  A ventral hernia is a bulge of tissue from inside the abdomen that pushes through a weak area of the muscles that form the front wall of the abdomen. The tissues inside the abdomen are inside a sac (peritoneum). These tissues include the small intestine, large intestine, and the fatty tissue that covers the intestines (omentum). Sometimes, the bulge that forms a hernia contains intestines. Other hernias contain only fat. Ventral hernias do not go away without surgical treatment. There are several types of ventral hernias. You may have:  A hernia at an incision site from previous abdominal surgery (incisional hernia).  A hernia just above the belly button (epigastric hernia), or at the belly button (umbilical hernia). These types of hernias can develop from heavy lifting or straining.  A hernia that comes and goes (reducible hernia). It may be visible only when you lift or strain. This type of hernia can be pushed back into the abdomen (reduced).  A hernia that traps abdominal tissue inside the hernia (incarcerated hernia). This type of hernia does not reduce.  A hernia that cuts off blood flow to the tissues inside the hernia (strangulated hernia). The tissues can start to die if this happens. This is a very painful bulge that cannot be reduced. A strangulated hernia is a medical emergency. What are the causes? This condition is caused by abdominal tissue putting pressure on an area of weakness in the abdominal muscles. What increases the risk? The following factors may make you more likely to develop this condition:  Being male.  Being 60 or older.  Being overweight or obese.  Having had previous abdominal surgery, especially if there was an infection after surgery.  Having had an injury to the abdominal wall.  Having had several pregnancies.  Having a buildup of fluid inside the abdomen (ascites). What are the signs or symptoms? The only symptom of a ventral hernia  may be a painless bulge in the abdomen. A reducible hernia may be visible only when you strain, cough, or lift. Other symptoms may include:  Dull pain.  A feeling of pressure. Signs and symptoms of a strangulated hernia may include:  Increasing pain.  Nausea and vomiting.  Pain when pressing on the hernia.  The skin over the hernia turning red or purple.  Constipation.  Blood in the stool (feces). How is this diagnosed? This condition may be diagnosed based on:  Your symptoms.  Your medical history.  A physical exam. You may be asked to cough or strain while standing. These actions increase the pressure inside your abdomen and force the hernia through the opening in your muscles. Your health care provider may try to reduce the hernia by pressing on it.  Imaging studies, such as an ultrasound or CT scan. How is this treated? This condition is treated with surgery. If you have a strangulated hernia, surgery is done as soon as possible. If your hernia is small and not incarcerated, you may be asked to lose some weight before surgery. Follow these instructions at home:  Follow instructions from your health care provider about eating or drinking restrictions.  If you are overweight, your health care provider may recommend that you increase your activity level and eat a healthier diet.  Do not lift anything that is heavier than 10 lb (4.5 kg).  Return to your normal activities as told by your health care provider. Ask your health care provider what activities are safe for you. You may need to avoid activities   that increase pressure on your hernia.  Take over-the-counter and prescription medicines only as told by your health care provider.  Keep all follow-up visits as told by your health care provider. This is important. Contact a health care provider if:  Your hernia gets larger.  Your hernia becomes painful. Get help right away if:  Your hernia becomes increasingly  painful.  You have pain along with any of the following: ? Changes in skin color in the area of the hernia. ? Nausea. ? Vomiting. ? Fever. Summary  A ventral hernia is a bulge of tissue from inside the abdomen that pushes through a weak area of the muscles that form the front wall of the abdomen.  This condition is treated with surgery, which may be urgent depending on your hernia.  Do not lift anything that is heavier than 10 lb (4.5 kg), and follow activity instructions from your health care provider. This information is not intended to replace advice given to you by your health care provider. Make sure you discuss any questions you have with your health care provider. Document Released: 02/01/2012 Document Revised: 03/29/2017 Document Reviewed: 09/05/2016 Elsevier Interactive Patient Education  2019 [employer]  Laparoscopic Ventral Hernia Repair Laparoscopic ventral hernia repairis a procedure to fix a bulge of tissue that pushes through a weak area of muscle in the abdomen (ventral hernia). A ventral hernia may be at the belly button (umbilical), above the belly button (epigastric), or at the incision site from previous abdominal surgery (incisional hernia). You may have this procedure as emergency surgery if part of your intestine gets trapped inside the hernia and starts to lose its blood supply (strangulation). Laparoscopic surgery is done through small incisions using a thin surgical telescope with a light and camera on the end (laparoscope). During surgery, your surgeon will use images from the laparoscope to guide the procedure. A mesh screen will be placed in the hernia to close the opening and strengthen the abdominal wall. Tell a health care provider about:  Any allergies you have.  All medicines you are taking, including vitamins, herbs, eye drops, creams, and over-the-counter medicines.  Any problems you or family members have had with anesthetic medicines.  Any blood  disorders you have.  Any surgeries you have had.  Any medical conditions you have.  Whether you are pregnant or may be pregnant. What are the risks? Generally, this is a safe procedure. However, problems may occur, including:  Infection.  Bleeding.  Allergic reactions to medicines.  Damage to other structures or organs in the abdomen.  Trouble urinating or having a bowel movement after surgery.  Pneumonia.  Blood clots.  The hernia coming back after surgery.  Fluid buildup in the area of the hernia. In some cases, your health care provider may need to switch from a laparoscopic procedure to a procedure that is done through a single, larger incision in the abdomen (open procedure). You may need an open procedure if:  You have a hernia that is difficult to repair.  Your organs are hard to see.  You have bleeding problems during the laparoscopic procedure. Medicines  Ask your health care provider about: ? Changing or stopping your regular medicines. This is especially important if you are taking diabetes medicines or blood thinners. ? Taking medicines such as aspirin and ibuprofen. These medicines can thin your blood. Do not take these medicines before your procedure if your health care provider instructs you not to.  You may be given antibiotic medicine  to help prevent infection. General instructions   You may be asked to take a laxative or do an enema to empty your bowel before surgery (bowel prep).  Do not use any products that contain nicotine or tobacco, such as cigarettes and e-cigarettes. If you need help quitting, ask your health care provider.  You may need to have tests before the procedure, such as: ? Blood tests. ? Urine tests. ? Abdominal ultrasound. ? Chest X-ray. ? Electrocardiogram (ECG).  Plan to have someone take you home from the hospital or clinic.  If you will be going home right after the procedure, plan to have someone with you for 24  hours. What happens during the procedure?  To reduce your risk of infection: ? Your health care team will wash or sanitize their hands. ? Your skin will be washed with soap.  An IV tube will be inserted into one of your veins.  You will be given one or more of the following: ? A medicine to help you relax (sedative). ? A medicine to make you fall asleep (general anesthetic).  A small incision will be made in your abdomen. A hollow metal tube (trocar) will be placed through the incision.  A tube will be placed through the trocar to inflate your abdomen with air-like gas. This makes it easier for your surgeon to see inside your abdomen and do the repair.  The laparoscope will be inserted into your abdomen through the trocar. The laparoscope will send images to a monitor in the operating room.  Other trocars will be put through other small incisions in your abdomen. The surgical instruments needed for the procedure will be placed through these trocars.  The tissue or intestines that make up the hernia will be moved back into place.  The edges of the hernia may be stitched together.  A piece of mesh will be used to close the hernia. Stitches (sutures), clips, or staples will be used to keep the mesh in place.  A bandage (dressing) or skin glue will be put over the incisions. The procedure may vary among health care providers and hospitals. What happens after the procedure?  Your blood pressure, heart rate, breathing rate, and blood oxygen level will be monitored until the medicines you were given have worn off.  You will continue to receive fluids and medicines through an IV tube. Your IV tube will be removed when you can drink clear fluids.  You will be given pain medicine as needed.  You will be encouraged to get up and walk around as soon as possible.  You may have to wear compression stockings. These stockings help to prevent blood clots and reduce swelling in your legs.  You  will be shown how to do deep breathing exercises to help prevent a lung infection.  Do not drive for 24 hours if you were given a sedative. This information is not intended to replace advice given to you by your health care provider. Make sure you discuss any questions you have with your health care provider. Document Released: 02/01/2012 Document Revised: 10/02/2015 Document Reviewed: 10/02/2015 Elsevier Interactive Patient Education  2019 [employer].

## 2018-07-24 NOTE — H&P (Signed)
[facility] History and Physical  Reason for Referral: Ventral Hernia (epigastric region)  Referring Physician:  Samantha Jackson   Chief Complaint    Umbilical Hernia      Jordan Banks is a 43 y.o. male.  HPI: Mr. Boulos is a 43 yo with a ventral hernia in his epigastric region that has been present for about 6-8 months and has been getting larger. He says that the area hurts when it is bulging out and he can fill contents sliding in and out. The pain sharp in nature and sometimes radiates along the left side of his abdomen.  He says that when he eats sometimes it feels more painful and tight in the area. He denies any associated nausea/vomiting or obstructive type symptoms. The hernia has never been stuck out and hard. He has had 2 prior inguinal hernia repairs, left and right with mesh, and says that he has always lifted during his life and has been active.  He does smoke. 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    Past Medical History:  Diagnosis Date  . Allergy   . Diverticulitis     Past Surgical History:  Procedure Laterality Date  . arm surgery  2001   Right;[facility]  . INGUINAL HERNIA REPAIR  03/05/2012   Procedure: HERNIA REPAIR INGUINAL ADULT;  Surgeon: Mark A Jenkins, MD;  Location: [facility];  Service: General;  Laterality: Right;  . INGUINAL HERNIA REPAIR Left 08/12/2015   Procedure: HERNIA REPAIR LEFT INGUINAL ADULT WITH MESH;  Surgeon: Makayla Evan Davis, MD;  Location: [facility];  Service: General;  Laterality: Left;  . INSERTION OF MESH  03/05/2012   Procedure: INSERTION OF MESH;  Surgeon: Mark A Jenkins, MD;  Location: [facility];  Service: General;  Laterality: Right;  . INSERTION OF MESH Left 08/12/2015   Procedure: INSERTION OF MESH;  Surgeon: Keyshaun Evan Davis, MD;  Location: [facility];  Service: General;  Laterality: Left;  . SHOULDER SURGERY    . VASECTOMY  2001   [place]    Family History  Problem Relation Age of Onset  . Breast cancer  Maternal Grandmother    No family history of heart problems   Social History   Tobacco Use  . Smoking status: Current Every Day Smoker    Packs/day: 1.00    Years: 20.00    Pack years: 20.00    Types: Cigarettes  . Smokeless tobacco: Never Used  Substance Use Topics  . Alcohol use: No  . Drug use: Yes    Types: Marijuana    Comment: few days    Medications: I have reviewed the patient's current medications. Allergies as of 07/24/2018   No Known Allergies     Medication List       Accurate as of Jul 24, 2018 10:18 AM. If you have any questions, ask your nurse or doctor.        STOP taking these medications   amoxicillin-clavulanate 875-125 MG tablet Commonly known as:  AUGMENTIN Stopped by:  Alandria Butkiewicz C Humza Tallerico, MD   ciprofloxacin 500 MG tablet Commonly known as:  CIPRO Stopped by:  Dencil Cayson C Britley Gashi, MD   metroNIDAZOLE 500 MG tablet Commonly known as:  FLAGYL Stopped by:  Concettina Leth C Demmi Sindt, MD   oxyCODONE-acetaminophen 5-325 MG tablet Commonly known as:  Roxicet Stopped by:  Zekiah Coen C Ashika Apuzzo, MD     TAKE these medications   diphenhydrAMINE 25 MG tablet Commonly known as:    BENADRYL Take 25 mg by mouth every 6 (six) hours as needed for allergies.   ibuprofen 400 MG tablet Commonly known as:  ADVIL Take 400 mg by mouth every 6 (six) hours as needed for moderate pain.        ROS:  A comprehensive review of systems was negative except for: Musculoskeletal: positive for back pain and stiff joints Allergic/Immunologic: positive for hay fever  Blood pressure 108/68, pulse 66, temperature 98.6 F (37 C), temperature source Temporal, resp. rate 16, height 6' 1" (1.854 m), weight 211 lb (95.7 kg), SpO2 96 %. Physical Exam Vitals signs reviewed.  Constitutional:      Appearance: Normal appearance.  HENT:     Head: Normocephalic.     Nose: Nose normal.     Mouth/Throat:     Mouth: Mucous membranes are moist.  Eyes:     Extraocular Movements: Extraocular  movements intact.     Pupils: Pupils are equal, round, and reactive to light.  Neck:     Musculoskeletal: Normal range of motion.  Cardiovascular:     Rate and Rhythm: Normal rate and regular rhythm.  Pulmonary:     Effort: Pulmonary effort is normal.     Breath sounds: Normal breath sounds.  Abdominal:     General: There is no distension.     Palpations: Abdomen is soft.     Tenderness: There is abdominal tenderness in the epigastric area.     Hernia: A hernia is present. Hernia is present in the ventral area.     Comments: 3cm defect in the epigastric region with some degree of associated diastasis,reduction of omentum  Musculoskeletal: Normal range of motion.        General: No swelling.  Skin:    General: Skin is warm and dry.  Neurological:     General: No focal deficit present.     Mental Status: He is alert and oriented to person, place, and time.  Psychiatric:        Mood and Affect: Mood normal.        Behavior: Behavior normal.        Thought Content: Thought content normal.        Judgment: Judgment normal.     Results: None   Assessment & Plan:  Jordan Banks is a 43 y.o. male with a ventral hernia/ epigastric hernia that is a little over 3 cm. He says the this area has been getting larger and causing him more pain. He would like to have it repaired.   We have discussed the COVID 19 pandemic. We have discussed [facility]'s limited elective surgery schedule and COVID 19 testing to keep all patient's safe during this pandemic. We have discussed the need for isolation after their testing and requirements of staying out of work, not going to stores or visiting with people.   We discussed a laparoscopic ventral hernia repair with mesh and the risk of bleeding, infection, injury to bowel, use of mesh, and risk of recurrence. He has opted to proceed. 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  He will have restrictions for 8 weeks after surgery with regards to lifting.    All questions were answered to the satisfaction of the patient.   Breana Litts C Tristen Pennino 07/24/2018, 10:18 AM       

## 2018-07-24 NOTE — Progress Notes (Signed)
[facility] History and Physical  Reason for Referral: Ventral Hernia (epigastric region)  Referring Physician:  Terie Purser   Chief Complaint    Umbilical Hernia      Jordan Banks is a 43 y.o. male.  HPI: Jordan Banks is a 43 yo with a ventral hernia in his epigastric region that has been present for about 6-8 months and has been getting larger. He says that the area hurts when it is bulging out and he can fill contents sliding in and out. The pain sharp in nature and sometimes radiates along the left side of his abdomen.  He says that when he eats sometimes it feels more painful and tight in the area. He denies any associated nausea/vomiting or obstructive type symptoms. The hernia has never been stuck out and hard. He has had 2 prior inguinal hernia repairs, left and right with mesh, and says that he has always lifted during his life and has been active.  He does smoke. 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    Past Medical History:  Diagnosis Date  . Allergy   . Diverticulitis     Past Surgical History:  Procedure Laterality Date  . arm surgery  2001   Right;[facility]  . INGUINAL HERNIA REPAIR  03/05/2012   Procedure: HERNIA REPAIR INGUINAL ADULT;  Surgeon: Dalia Heading, MD;  Location: [facility];  Service: General;  Laterality: Right;  . INGUINAL HERNIA REPAIR Left 08/12/2015   Procedure: HERNIA REPAIR LEFT INGUINAL ADULT WITH MESH;  Surgeon: Ancil Linsey, MD;  Location: [facility];  Service: General;  Laterality: Left;  . INSERTION OF MESH  03/05/2012   Procedure: INSERTION OF MESH;  Surgeon: Dalia Heading, MD;  Location: [facility];  Service: General;  Laterality: Right;  . INSERTION OF MESH Left 08/12/2015   Procedure: INSERTION OF MESH;  Surgeon: Ancil Linsey, MD;  Location: [facility];  Service: General;  Laterality: Left;  . SHOULDER SURGERY    . VASECTOMY  2001   [facility]    Family History  Problem Relation Age of Onset  . Breast cancer  Maternal Grandmother    No family history of heart problems   Social History   Tobacco Use  . Smoking status: Current Every Day Smoker    Packs/day: 1.00    Years: 20.00    Pack years: 20.00    Types: Cigarettes  . Smokeless tobacco: Never Used  Substance Use Topics  . Alcohol use: No  . Drug use: Yes    Types: Marijuana    Comment: few days    Medications: I have reviewed the patient's current medications. Allergies as of 07/24/2018   No Known Allergies     Medication List       Accurate as of Jul 24, 2018 10:18 AM. If you have any questions, ask your nurse or doctor.        STOP taking these medications   amoxicillin-clavulanate 875-125 MG tablet Commonly known as:  AUGMENTIN Stopped by:  Jordan Roers, MD   ciprofloxacin 500 MG tablet Commonly known as:  CIPRO Stopped by:  Jordan Roers, MD   metroNIDAZOLE 500 MG tablet Commonly known as:  FLAGYL Stopped by:  Jordan Roers, MD   oxyCODONE-acetaminophen 5-325 MG tablet Commonly known as:  Roxicet Stopped by:  Jordan Roers, MD     TAKE these medications   diphenhydrAMINE 25 MG tablet Commonly known as:  BENADRYL Take 25 mg by mouth every 6 (six) hours as needed for allergies.   ibuprofen 400 MG tablet Commonly known as:  ADVIL Take 400 mg by mouth every 6 (six) hours as needed for moderate pain.        ROS:  A comprehensive review of systems was negative except for: Musculoskeletal: positive for back pain and stiff joints Allergic/Immunologic: positive for hay fever  Blood pressure 108/68, pulse 66, temperature 98.6 F (37 C), temperature source Temporal, resp. rate 16, height 6\' 1"  (1.854 m), weight 211 lb (95.7 kg), SpO2 96 %. Physical Exam Vitals signs reviewed.  Constitutional:      Appearance: Normal appearance.  HENT:     Head: Normocephalic.     Nose: Nose normal.     Mouth/Throat:     Mouth: Mucous membranes are moist.  Eyes:     Extraocular Movements: Extraocular  movements intact.     Pupils: Pupils are equal, round, and reactive to light.  Neck:     Musculoskeletal: Normal range of motion.  Cardiovascular:     Rate and Rhythm: Normal rate and regular rhythm.  Pulmonary:     Effort: Pulmonary effort is normal.     Breath sounds: Normal breath sounds.  Abdominal:     General: There is no distension.     Palpations: Abdomen is soft.     Tenderness: There is abdominal tenderness in the epigastric area.     Hernia: A hernia is present. Hernia is present in the ventral area.     Comments: 3cm defect in the epigastric region with some degree of associated diastasis,reduction of omentum  Musculoskeletal: Normal range of motion.        General: No swelling.  Skin:    General: Skin is warm and dry.  Neurological:     General: No focal deficit present.     Mental Status: He is alert and oriented to person, place, and time.  Psychiatric:        Mood and Affect: Mood normal.        Behavior: Behavior normal.        Thought Content: Thought content normal.        Judgment: Judgment normal.     Results: None   Assessment & Plan:  Jordan Banks is a 43 y.o. male with a ventral hernia/ epigastric hernia that is a little over 3 cm. He says the this area has been getting larger and causing him more pain. He would like to have it repaired.   We have discussed the COVID 19 pandemic. We have discussed [facility]'s limited elective surgery schedule and COVID 19 testing to keep all patient's safe during this pandemic. We have discussed the need for isolation after their testing and requirements of staying out of work, not going to stores or visiting with people.   We discussed a laparoscopic ventral hernia repair with mesh and the risk of bleeding, infection, injury to bowel, use of mesh, and risk of recurrence. He has opted to proceed. 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  He will have restrictions for 8 weeks after surgery with regards to lifting.    All questions were answered to the satisfaction of the patient.   Jordan RoersLindsay C Banks 07/24/2018, 10:18 AM

## 2018-08-15 ENCOUNTER — Encounter (HOSPITAL_COMMUNITY): Payer: Self-pay

## 2018-08-15 ENCOUNTER — Other Ambulatory Visit: Payer: Self-pay

## 2018-08-16 ENCOUNTER — Encounter (HOSPITAL_COMMUNITY)
Admission: RE | Admit: 2018-08-16 | Discharge: 2018-08-16 | Disposition: A | Discharge status: Home / Self Care | Payer: BC Managed Care – PPO | Source: Ambulatory Visit | Attending: General Surgery | Admitting: General Surgery

## 2018-08-16 ENCOUNTER — Other Ambulatory Visit (HOSPITAL_COMMUNITY)
Admission: RE | Admit: 2018-08-16 | Discharge: 2018-08-16 | Disposition: A | Discharge status: Home / Self Care | Payer: BC Managed Care – PPO | Source: Ambulatory Visit | Attending: General Surgery | Admitting: General Surgery

## 2018-08-16 DIAGNOSIS — Z1159 Encounter for screening for other viral diseases: Secondary | ICD-10-CM | POA: Insufficient documentation

## 2018-08-17 LAB — NOVEL CORONAVIRUS, NAA (HOSP ORDER, SEND-OUT TO REF LAB; TAT 18-24 HRS): SARS-CoV-2, NAA: NOT DETECTED

## 2018-08-20 ENCOUNTER — Encounter (HOSPITAL_COMMUNITY): Payer: Self-pay | Admitting: *Deleted

## 2018-08-20 ENCOUNTER — Encounter (HOSPITAL_COMMUNITY)
Admission: RE | Disposition: A | Discharge status: Home / Self Care | Payer: Self-pay | Source: Home / Self Care | Attending: General Surgery

## 2018-08-20 ENCOUNTER — Ambulatory Visit (HOSPITAL_COMMUNITY): Payer: BC Managed Care – PPO | Admitting: Anesthesiology

## 2018-08-20 ENCOUNTER — Ambulatory Visit (HOSPITAL_COMMUNITY)
Admission: RE | Admit: 2018-08-20 | Discharge: 2018-08-20 | Disposition: A | Discharge status: Home / Self Care | Payer: BC Managed Care – PPO | Attending: General Surgery | Admitting: General Surgery

## 2018-08-20 DIAGNOSIS — F17210 Nicotine dependence, cigarettes, uncomplicated: Secondary | ICD-10-CM | POA: Insufficient documentation

## 2018-08-20 DIAGNOSIS — Z803 Family history of malignant neoplasm of breast: Secondary | ICD-10-CM | POA: Diagnosis not present

## 2018-08-20 DIAGNOSIS — K439 Ventral hernia without obstruction or gangrene: Secondary | ICD-10-CM | POA: Diagnosis not present

## 2018-08-20 DIAGNOSIS — Z791 Long term (current) use of non-steroidal anti-inflammatories (NSAID): Secondary | ICD-10-CM | POA: Insufficient documentation

## 2018-08-20 HISTORY — PX: VENTRAL HERNIA REPAIR: SHX424

## 2018-08-20 SURGERY — REPAIR, HERNIA, VENTRAL, LAPAROSCOPIC
Anesthesia: General | Site: Abdomen

## 2018-08-20 MED ORDER — HYDROMORPHONE HCL 1 MG/ML IJ SOLN
INTRAMUSCULAR | Status: AC
  Filled 2018-08-20: qty 1

## 2018-08-20 MED ORDER — HYDROCODONE-ACETAMINOPHEN 7.5-325 MG PO TABS
1.0000 | ORAL_TABLET | Freq: Once | ORAL | Status: AC | PRN
  Administered 2018-08-20: 1 via ORAL

## 2018-08-20 MED ORDER — SUCCINYLCHOLINE CHLORIDE 20 MG/ML IJ SOLN
INTRAMUSCULAR | Status: DC | PRN
  Administered 2018-08-20: 180 mg via INTRAVENOUS

## 2018-08-20 MED ORDER — CHLORHEXIDINE GLUCONATE CLOTH 2 % EX PADS: 6.0000 | MEDICATED_PAD | Freq: Once | CUTANEOUS | Status: DC

## 2018-08-20 MED ORDER — CEFAZOLIN SODIUM-DEXTROSE 2-4 GM/100ML-% IV SOLN
2.0000 g | INTRAVENOUS | Status: AC
  Administered 2018-08-20: 2 g via INTRAVENOUS

## 2018-08-20 MED ORDER — FENTANYL CITRATE (PF) 100 MCG/2ML IJ SOLN
INTRAMUSCULAR | Status: AC
  Filled 2018-08-20: qty 2

## 2018-08-20 MED ORDER — HYDROCODONE-ACETAMINOPHEN 7.5-325 MG PO TABS
ORAL_TABLET | ORAL | Status: AC
  Filled 2018-08-20: qty 1

## 2018-08-20 MED ORDER — DIPHENHYDRAMINE HCL 50 MG/ML IJ SOLN
INTRAMUSCULAR | Status: AC
  Filled 2018-08-20: qty 1

## 2018-08-20 MED ORDER — LACTATED RINGERS IV SOLN: INTRAVENOUS | Status: DC

## 2018-08-20 MED ORDER — MIDAZOLAM HCL 5 MG/5ML IJ SOLN
INTRAMUSCULAR | Status: DC | PRN
  Administered 2018-08-20: 2 mg via INTRAVENOUS

## 2018-08-20 MED ORDER — MEPERIDINE HCL 50 MG/ML IJ SOLN: 6.2500 mg | INTRAMUSCULAR | Status: DC | PRN

## 2018-08-20 MED ORDER — SUGAMMADEX SODIUM 500 MG/5ML IV SOLN
INTRAVENOUS | Status: AC
  Filled 2018-08-20: qty 5

## 2018-08-20 MED ORDER — ROCURONIUM BROMIDE 50 MG/5ML IV SOSY
PREFILLED_SYRINGE | INTRAVENOUS | Status: DC | PRN
  Administered 2018-08-20: 10 mg via INTRAVENOUS
  Administered 2018-08-20: 30 mg via INTRAVENOUS
  Administered 2018-08-20: 10 mg via INTRAVENOUS

## 2018-08-20 MED ORDER — BUPIVACAINE LIPOSOME 1.3 % IJ SUSP
INTRAMUSCULAR | Status: DC | PRN
  Administered 2018-08-20: 20 mL

## 2018-08-20 MED ORDER — KETOROLAC TROMETHAMINE 30 MG/ML IJ SOLN
30.0000 mg | Freq: Once | INTRAMUSCULAR | Status: AC
  Administered 2018-08-20: 12:00:00 30 mg via INTRAVENOUS

## 2018-08-20 MED ORDER — CEFAZOLIN SODIUM-DEXTROSE 2-4 GM/100ML-% IV SOLN
INTRAVENOUS | Status: AC
  Filled 2018-08-20: qty 100

## 2018-08-20 MED ORDER — PROPOFOL 10 MG/ML IV BOLUS
INTRAVENOUS | Status: AC
  Filled 2018-08-20: qty 20

## 2018-08-20 MED ORDER — PROMETHAZINE HCL 25 MG/ML IJ SOLN: 6.2500 mg | INTRAMUSCULAR | Status: DC | PRN

## 2018-08-20 MED ORDER — SUGAMMADEX SODIUM 500 MG/5ML IV SOLN
INTRAVENOUS | Status: DC | PRN
  Administered 2018-08-20: 200 mg via INTRAVENOUS
  Administered 2018-08-20: 100 mg via INTRAVENOUS

## 2018-08-20 MED ORDER — HYDROMORPHONE HCL 1 MG/ML IJ SOLN
0.2500 mg | INTRAMUSCULAR | Status: DC | PRN
  Administered 2018-08-20 (×4): 0.5 mg via INTRAVENOUS
  Filled 2018-08-20 (×3): qty 0.5

## 2018-08-20 MED ORDER — DOCUSATE SODIUM 100 MG PO CAPS: 100.0000 mg | ORAL_CAPSULE | Freq: Two times a day (BID) | ORAL | 2 refills | Status: AC

## 2018-08-20 MED ORDER — OXYCODONE HCL 5 MG PO TABS: 5.0000 mg | ORAL_TABLET | ORAL | 0 refills | Status: DC | PRN

## 2018-08-20 MED ORDER — FENTANYL CITRATE (PF) 100 MCG/2ML IJ SOLN
INTRAMUSCULAR | Status: DC | PRN
  Administered 2018-08-20: 50 ug via INTRAVENOUS
  Administered 2018-08-20: 100 ug via INTRAVENOUS
  Administered 2018-08-20: 50 ug via INTRAVENOUS

## 2018-08-20 MED ORDER — LACTATED RINGERS IV SOLN
INTRAVENOUS | Status: DC | PRN
  Administered 2018-08-20: 09:00:00 via INTRAVENOUS

## 2018-08-20 MED ORDER — MIDAZOLAM HCL 2 MG/2ML IJ SOLN
INTRAMUSCULAR | Status: AC
  Filled 2018-08-20: qty 2

## 2018-08-20 MED ORDER — LIDOCAINE 2% (20 MG/ML) 5 ML SYRINGE
INTRAMUSCULAR | Status: DC | PRN
  Administered 2018-08-20: 40 mg via INTRAVENOUS

## 2018-08-20 MED ORDER — DIPHENHYDRAMINE HCL 50 MG/ML IJ SOLN
25.0000 mg | Freq: Once | INTRAMUSCULAR | Status: AC
  Administered 2018-08-20: 25 mg via INTRAVENOUS

## 2018-08-20 MED ORDER — PROPOFOL 10 MG/ML IV BOLUS
INTRAVENOUS | Status: DC | PRN
  Administered 2018-08-20: 200 mg via INTRAVENOUS

## 2018-08-20 MED ORDER — 0.9 % SODIUM CHLORIDE (POUR BTL) OPTIME
TOPICAL | Status: DC | PRN
  Administered 2018-08-20: 1000 mL

## 2018-08-20 MED ORDER — KETOROLAC TROMETHAMINE 30 MG/ML IJ SOLN
INTRAMUSCULAR | Status: AC
  Filled 2018-08-20: qty 1

## 2018-08-20 MED ORDER — LACTATED RINGERS IV SOLN
INTRAVENOUS | Status: DC
  Administered 2018-08-20: 10:00:00 via INTRAVENOUS

## 2018-08-20 MED ORDER — ROCURONIUM BROMIDE 10 MG/ML (PF) SYRINGE
PREFILLED_SYRINGE | INTRAVENOUS | Status: AC
  Filled 2018-08-20: qty 10

## 2018-08-20 SURGICAL SUPPLY — 41 items
BLADE SURG 15 STRL LF DISP TIS (BLADE) ×1 IMPLANT
BLADE SURG 15 STRL SS (BLADE) ×2
CHLORAPREP W/TINT 26 (MISCELLANEOUS) ×3 IMPLANT
CLOTH BEACON ORANGE TIMEOUT ST (SAFETY) ×3 IMPLANT
COVER LIGHT HANDLE STERIS (MISCELLANEOUS) ×6 IMPLANT
DERMABOND ADVANCED (GAUZE/BANDAGES/DRESSINGS) ×4
DERMABOND ADVANCED .7 DNX12 (GAUZE/BANDAGES/DRESSINGS) ×1 IMPLANT
DEVICE TROCAR PUNCTURE CLOSURE (ENDOMECHANICALS) ×3 IMPLANT
ELECT REM PT RETURN 9FT ADLT (ELECTROSURGICAL) ×3
ELECTRODE REM PT RTRN 9FT ADLT (ELECTROSURGICAL) ×1 IMPLANT
FILTER SMOKE EVAC LAPAROSHD (FILTER) ×3 IMPLANT
GLOVE BIO SURGEON STRL SZ 6.5 (GLOVE) ×2 IMPLANT
GLOVE BIO SURGEONS STRL SZ 6.5 (GLOVE) ×1
GLOVE BIOGEL PI IND STRL 6.5 (GLOVE) ×1 IMPLANT
GLOVE BIOGEL PI IND STRL 7.0 (GLOVE) ×1 IMPLANT
GLOVE BIOGEL PI INDICATOR 6.5 (GLOVE) ×2
GLOVE BIOGEL PI INDICATOR 7.0 (GLOVE) ×4
GLOVE SURG SS PI 7.5 STRL IVOR (GLOVE) ×6 IMPLANT
GOWN STRL REUS W/TWL LRG LVL3 (GOWN DISPOSABLE) ×9 IMPLANT
INST SET LAPROSCOPIC AP (KITS) ×3 IMPLANT
KIT TURNOVER KIT A (KITS) ×3 IMPLANT
LIGASURE LAP ATLAS 10MM 37CM (INSTRUMENTS) ×3 IMPLANT
MANIFOLD NEPTUNE II (INSTRUMENTS) ×3 IMPLANT
MESH VENTRALIGHT ST 4X6IN (Mesh General) ×2 IMPLANT
NDL INSUFFLATION 14GA 120MM (NEEDLE) ×1 IMPLANT
NEEDLE HYPO 22GX1.5 SAFETY (NEEDLE) ×3 IMPLANT
NEEDLE INSUFFLATION 14GA 120MM (NEEDLE) ×3 IMPLANT
NS IRRIG 1000ML POUR BTL (IV SOLUTION) ×3 IMPLANT
PACK LAP CHOLE LZT030E (CUSTOM PROCEDURE TRAY) ×3 IMPLANT
PAD ARMBOARD 7.5X6 YLW CONV (MISCELLANEOUS) ×3 IMPLANT
PENCIL HANDSWITCHING (ELECTRODE) ×3 IMPLANT
SET BASIN LINEN APH (SET/KITS/TRAYS/PACK) ×3 IMPLANT
SUT MNCRL AB 4-0 PS2 18 (SUTURE) ×6 IMPLANT
SUT PROLENE 2 0 CR (SUTURE) ×3 IMPLANT
SUT VICRYL 0 UR6 27IN ABS (SUTURE) ×4 IMPLANT
SYR 20CC LL (SYRINGE) ×3 IMPLANT
TACKER 5MM HERNIA 3.5CML NAB (ENDOMECHANICALS) ×4 IMPLANT
TROCAR ENDO BLADELESS 11MM (ENDOMECHANICALS) ×3 IMPLANT
TROCAR XCEL NON-BLD 5MMX100MML (ENDOMECHANICALS) ×3 IMPLANT
TROCAR XCEL UNIV SLVE 11M 100M (ENDOMECHANICALS) ×6 IMPLANT
WARMER LAPAROSCOPE (MISCELLANEOUS) ×3 IMPLANT

## 2018-08-20 NOTE — Transfer of Care (Signed)
Immediate Anesthesia Transfer of Care Note  Patient: Jordan Banks  Procedure(s) Performed: LAPAROSCOPIC VENTRAL HERNIA WITH MESH (N/A Abdomen)  Patient Location: PACU  Anesthesia Type:General  Level of Consciousness: awake and patient cooperative  Airway & Oxygen Therapy: Patient Spontanous Breathing and Patient connected to nasal cannula oxygen  Post-op Assessment: Report given to RN, Post -op Vital signs reviewed and stable and Patient moving all extremities  Post vital signs: Reviewed and stable  Last Vitals:  Vitals Value Taken Time  BP    Temp    Pulse    Resp    SpO2      Last Pain:  Vitals:   08/20/18 0854  TempSrc: Oral  PainSc: 0-No pain      Patients Stated Pain Goal: 5 (78/[id number]/85 0277)  Complications: No apparent anesthesia complications

## 2018-08-20 NOTE — Op Note (Signed)
[facility] Operative Note  08/20/18  Preoperative Diagnosis: Ventral (Epigastric) Hernia    Postoperative Diagnosis: Same   Procedure(s) Performed: Laparoscopic hernia repair with mesh (Ventralight ST 10.2X15.2cm)    Surgeon: Leatrice JewelsLindsay C. Henreitta LeberBridges, MD   Assistants: Franky MachoMark Jenkins, MD     Anesthesia: General endotracheal   Anesthesiologist: Arbie Cookeyameransi, Benjamin G, MD    Specimens: None    Estimated Blood Loss: Minimal   Blood Replacement: None    Complications: None   Wound Class: Clean    Operative Indications: Mr. 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  He has noticed a hernia in his epigastric region for some time and it has been getting larger. We discussed the risk of laparoscopic repair with mesh including but no limited to bleeding, infection, use of mesh, recurrence of hernia, injury to other organs, and he opted to proceed.   Findings:Epigastric hernia measuring 5cm X 8cm (vertical axis longest)    Procedure: The patient was taken to the operating room and placed supine. General endotracheal anesthesia was induced. Intravenous antibiotics were administered per protocol.  An orogastric tube positioned to decompress the stomach. The abdomen was prepared and draped in the usual sterile fashion.   A small skin knick was made in the infraumbilical site, and a towel clip was used to elevate the umbilicus. A Veress needle was inserted and the saline drop test and low insufflation pressures indicated intraperitoneal access.  Once insufflation pressures were 15 mm Hg, a left upper quadrant incision was made laterally, and a 11 mm Optiview trocar was used to access the peritoneum.  On entry there was no signs of injury at the trocar site or the umbilical Veress site.  I noted a hernia in the epigastric region measuring about 5cm wide X 8cm long in the midline. There was omentum adherent to the colon in the hernia  and some falciform.  Additional 11 mm trocars were placed under direct visualization in the left lower quadrant and right upper quadrant.  Using blunt graspers and the Ligasure, the omentum was pulled out of the hernia sac. The falciform was then cleared from the opening using the Ligasure. Hemostasis was confirmed. Again we measured the defect and it appeared to be two small holes that spanned 8cm in the midline, and then no more than 5cm wide at the widest point.  We opted to use the Ventralight ST mesh 10.2X15.2 cm to give adequate coverage of the area.    The mesh was marked with the smooth side to be down to the bowel.  Four sutures 2-0 prolene were placed at the 12, 3, 6 and 9 o'clock positions.  The mesh was then rolled around the grasper and placed into the abdomen through the left lower quadrant trocar. The mesh was positioned under the hernia with the smooth side down to the bowel.  A small skin knick was made superior to the hernia, and a suture passer was used to grasp the superior prolene suture.  This was done again on the right, inferior and left sides of the mesh.  The mesh was smooth to the abdominal wall. Using a Protack device, the mesh was secured with tacks circumferentially in 2 rows.  We confirmed hemostasis and adequate coverage over the hernia.  The abdomen was then desufflated and the trocars were removed. The defects were smaller than my finger, and the upper port sites were right at the rib  cage.  Xparel was injected. The skin was closed with 4-0 Monocryl and dermabond. The tacking skin knicks and Veress skin knick were closed with dermabond.    Dr. Arnoldo Morale was assisting throughout the procedure and was present for the critical portions of the case.   Final inspection revealed acceptable hemostasis. All counts were correct at the end of the case. The patient was awakened from anesthesia and extubated without complication.  The patient went to the PACU in stable condition.    Curlene Labrum, MD [facility] [address] [place], [place] [address] [phone] (office)

## 2018-08-20 NOTE — Anesthesia Postprocedure Evaluation (Signed)
Anesthesia Post Note  Patient: Jordan Banks  Procedure(s) Performed: LAPAROSCOPIC VENTRAL HERNIA WITH MESH (N/A Abdomen)  Patient location during evaluation: PACU Anesthesia Type: General Level of consciousness: awake and alert and patient cooperative Pain management: pain level controlled Vital Signs Assessment: post-procedure vital signs reviewed and stable Respiratory status: spontaneous breathing and respiratory function stable Cardiovascular status: blood pressure returned to baseline Postop Assessment: no apparent nausea or vomiting Anesthetic complications: no     Last Vitals:  Vitals:   08/20/18 1130 08/20/18 1145  BP: 136/78 126/73  Pulse: 67 76  Resp: 13 (!) 23  Temp:    SpO2: 99% 97%    Last Pain:  Vitals:   08/20/18 1145  TempSrc:   PainSc: 6                  [name] J

## 2018-08-20 NOTE — Interval H&P Note (Signed)
History and Physical Interval Note:  08/20/2018 9:13 AM  Jordan Banks  has presented today for surgery, with the diagnosis of ventral hernia.  The various methods of treatment have been discussed with the patient and family. After consideration of risks, benefits and other options for treatment, the patient has consented to  Procedure(s): [place] (N/A) as a surgical intervention.  The patient's history has been reviewed, patient examined, no change in status, stable for surgery.  I have reviewed the patient's chart and labs.  Questions were answered to the patient's satisfaction.    No questions or changes. Looking at 6-8 weeks out of work due to lifting.   Virl Cagey

## 2018-08-20 NOTE — Progress Notes (Signed)
[facility]  Updated his wife Janett Billow regarding surgery. Told her about the skin irritation around the glue and possible need for benadryl. Will see in 5 weeks for follow up. Plan for FMLA for 8 weeks after surgery to ensure healing.   Curlene Labrum, MD [facility] [address] [place], [place] [address] [phone] (office)

## 2018-08-20 NOTE — Anesthesia Preprocedure Evaluation (Signed)
Anesthesia Evaluation    Airway Mallampati: II       Dental  (+) Teeth Intact   Pulmonary Current Smoker,    breath sounds clear to auscultation       Cardiovascular  Rhythm:regular     Neuro/Psych    GI/Hepatic   Endo/Other    Renal/GU      Musculoskeletal   Abdominal   Peds  Hematology   Anesthesia Other Findings Denies any sig PMH Tobacco and MJ use/abuse  Reproductive/Obstetrics                             Anesthesia Physical Anesthesia Plan  ASA: II  Anesthesia Plan: General   Post-op Pain Management:    Induction:   PONV Risk Score and Plan:   Airway Management Planned:   Additional Equipment:   Intra-op Plan:   Post-operative Plan:   Informed Consent: I have reviewed the patients History and Physical, chart, labs and discussed the procedure including the risks, benefits and alternatives for the proposed anesthesia with the patient or authorized representative who has indicated his/her understanding and acceptance.       Plan Discussed with: Anesthesiologist  Anesthesia Plan Comments:         Anesthesia Quick Evaluation

## 2018-08-20 NOTE — Anesthesia Procedure Notes (Signed)
Procedure Name: Intubation Date/Time: 08/20/2018 9:41 AM Performed by: Andree Elk, Zakariyah Freimark A, CRNA Pre-anesthesia Checklist: Patient identified, Patient being monitored, Timeout performed, Emergency Drugs available and Suction available Patient Re-evaluated:Patient Re-evaluated prior to induction Oxygen Delivery Method: Circle System Utilized Preoxygenation: Pre-oxygenation with 100% oxygen Induction Type: IV induction Laryngoscope Size: Miller and 3 Grade View: Grade I Tube type: Oral Tube size: 7.0 mm Number of attempts: 1 Airway Equipment and Method: Stylet Placement Confirmation: ETT inserted through vocal cords under direct vision,  positive ETCO2 and breath sounds checked- equal and bilateral Secured at: 21 cm Tube secured with: Tape Dental Injury: Teeth and Oropharynx as per pre-operative assessment

## 2018-08-20 NOTE — Discharge Instructions (Signed)
General Anesthesia, Adult, Care After °This sheet gives you information about how to care for yourself after your procedure. Your health care provider may also give you more specific instructions. If you have problems or questions, contact your health care provider. °What can I expect after the procedure? °After the procedure, the following side effects are common: °· Pain or discomfort at the IV site. °· Nausea. °· Vomiting. °· Sore throat. °· Trouble concentrating. °· Feeling cold or chills. °· Weak or tired. °· Sleepiness and fatigue. °· Soreness and body aches. These side effects can affect parts of the body that were not involved in surgery. °Follow these instructions at home: ° °For at least 24 hours after the procedure: °· Have a responsible adult stay with you. It is important to have someone help care for you until you are awake and alert. °· Rest as needed. °· Do not: °? Participate in activities in which you could fall or become injured. °? Drive. °? Use heavy machinery. °? Drink alcohol. °? Take sleeping pills or medicines that cause drowsiness. °? Make important decisions or sign legal documents. °? Take care of children on your own. °Eating and drinking °· Follow any instructions from your health care provider about eating or drinking restrictions. °· When you feel hungry, start by eating small amounts of foods that are soft and easy to digest (bland), such as toast. Gradually return to your regular diet. °· Drink enough fluid to keep your urine pale yellow. °· If you vomit, rehydrate by drinking water, juice, or clear broth. °General instructions °· If you have sleep apnea, surgery and certain medicines can increase your risk for breathing problems. Follow instructions from your health care provider about wearing your sleep device: °? Anytime you are sleeping, including during daytime naps. °? While taking prescription pain medicines, sleeping medicines, or medicines that make you drowsy. °· Return to  your normal activities as told by your health care provider. Ask your health care provider what activities are safe for you. °· Take over-the-counter and prescription medicines only as told by your health care provider. °· If you smoke, do not smoke without supervision. °· Keep all follow-up visits as told by your health care provider. This is important. °Contact a health care provider if: °· You have nausea or vomiting that does not get better with medicine. °· You cannot eat or drink without vomiting. °· You have pain that does not get better with medicine. °· You are unable to pass urine. °· You develop a skin rash. °· You have a fever. °· You have redness around your IV site that gets worse. °Get help right away if: °· You have difficulty breathing. °· You have chest pain. °· You have blood in your urine or stool, or you vomit blood. °Summary °· After the procedure, it is common to have a sore throat or nausea. It is also common to feel tired. °· Have a responsible adult stay with you for the first 24 hours after general anesthesia. It is important to have someone help care for you until you are awake and alert. °· When you feel hungry, start by eating small amounts of foods that are soft and easy to digest (bland), such as toast. Gradually return to your regular diet. °· Drink enough fluid to keep your urine pale yellow. °· Return to your normal activities as told by your health care provider. Ask your health care provider what activities are safe for you. °This information is not   is not intended to replace advice given to you by your health care provider. Make sure you discuss any questions you have with your health care provider. Document Released: 05/23/2000 Document Revised: 09/30/2016 Document Reviewed: 09/30/2016 Elsevier Interactive Patient Education  2019 [employer]    Discharge Instructions:  Common Complaints: Pain at the incision site is common. This will improve with time.   Pain over the area of the mesh is also common due to inflammation from healing /scarring down the mesh. Take your pain medications as described below. Some nausea is common and poor appetite. The main goal is to stay hydrated the first few days after surgery.   Diet/ Activity: Diet as tolerated. You may not have a large appetite, but it is important to stay hydrated.  Drink 64 ounces of water a day. Your appetite will return with time.  Discharge Laparoscopic Surgery Instructions:  Common Complaints: Right shoulder pain is common after laparoscopic surgery. This is secondary to the gas used in the surgery being trapped under the diaphragm.  Walk to help your body absorb the gas. This will improve in a few days. Pain at the port sites are common, especially the larger port sites. This will improve with time.  Some nausea is common and poor appetite. The main goal is to stay hydrated the first few days after surgery.   Diet/ Activity: Diet as tolerated. You may not have an appetite, but it is important to stay hydrated. Drink 64 ounces of water a day. Your appetite will return with time.  Do not pick at the dermabond glue on your incision sites.  This glue film will remain in place for 1-2 weeks and will start to peel off.  Do not place lotions or balms on your incision unless instructed to specifically by Dr. Henreitta LeberBridges.  Shower per your regular routine daily.  Do not take hot showers. Take warm showers that are less than 10 minutes. Path the incision dry. Wear an abdominal binder daily with activity. You do not have to wear this while sleeping or sitting.  Rest and listen to your body, but do not remain in bed all day.  Walk everyday for at least 15-20 minutes. Deep cough and move around every 1-2 hours in the first few days after surgery.  Do not lift > 10 lbs, perform excessive bending, pushing, pulling, squatting for 6-8 weeks after surgery.  The activity restrictions and the abdominal  binder are to prevent hernia formation at your incision while you are healing.    You had a little bit of a reaction to the skin glue where your skin was slightly red around the edges. You were given some benadryl for this reaction. Monitor this as needed. As long as the redness is not getting worse, then take benadryl if needed for itching, etc.  If the redness is spreading or getting worse, please notify Dr. Henreitta LeberBridges.   Medication: Take tylenol and ibuprofen as needed for pain control, alternating every 4-6 hours.  Example:  Tylenol 1000mg  @ 6am, 12noon, 6pm, 12midnight (Do not exceed 4000mg  of tylenol a day). Ibuprofen 800mg  @ 9am, 3pm, 9pm, 3am (Do not exceed 3600mg  of ibuprofen a day).  Take Roxicodone for breakthrough pain every 4 hours.  Take Colace for constipation related to narcotic pain medication. If you do not have a bowel movement in 2 days, take Miralax over the counter.  Drink plenty of water to also prevent constipation.   Contact Information: If you have questions or  concerns, please call our office, [phone], Monday- Thursday 8AM-5PM and Friday 8AM-12Noon.  If it is after hours or on the weekend, please call [facility] Main Number, [phone], and ask to speak to the surgeon on call for Dr. Henreitta LeberBridges at [facility].   Laparoscopic Ventral Hernia Repair, Care After This sheet gives you information about how to care for yourself after your procedure. Your health care provider may also give you more specific instructions. If you have problems or questions, contact your health care provider. What can I expect after the procedure? After the procedure, it is common to have:  Pain, discomfort, or soreness. Follow these instructions at home: Incision care   Follow instructions from your health care provider about how to take care of your incision. Make sure you: ? Wash your hands with soap and water before you change your bandage (dressing) or before you touch your abdomen. If  soap and water are not available, use hand sanitizer. ? Change your dressing as told by your health care provider. ? Leave stitches (sutures), skin glue, or adhesive strips in place. These skin closures may need to stay in place for 2 weeks or longer. If adhesive strip edges start to loosen and curl up, you may trim the loose edges. Do not remove adhesive strips completely unless your health care provider tells you to do that.  Check your incision area every day for signs of infection. Check for: ? Redness, swelling, or pain. ? Fluid or blood. ? Warmth. ? Pus or a bad smell. Bathing   Do not take baths, swim, or use a hot tub until your health care provider approves.   You may shower. Activity  Do not lift anything that is heavier than 10 lb (4.5 kg) until your health care provider approves.  Do not drive or use heavy machinery while taking prescription pain medicine. Ask your health care provider when it is safe for you to drive or use heavy machinery.  Do not drive for 24 hours if you were given a medicine to help you relax (sedative) during your procedure.  Rest as told by your health care provider. You may return to your normal activities when your health care provider approves. General instructions  Take over-the-counter and prescription medicines only as told by your health care provider.  To prevent or treat constipation while you are taking prescription pain medicine, your health care provider may recommend that you: ? Take over-the-counter or prescription medicines. ? Eat foods that are high in fiber, such as fresh fruits and vegetables, whole grains, and beans. ? Limit foods that are high in fat and processed sugars, such as fried and sweet foods.  Drink enough fluid to keep your urine clear or pale yellow.  Hold a pillow over your abdomen when you cough or sneeze. This helps with pain.  Keep all follow-up visits as told by your health care provider. This is  important. Contact a health care provider if:  You have: ? A fever or chills. ? Redness, swelling, or pain around your incision. ? Fluid or blood coming from your incision. ? Pus or a bad smell coming from your incision. ? Pain that gets worse or does not get better with medicine. ? Nausea or vomiting. ? A cough. ? Shortness of breath.  Your incision feels warm to the touch.  You have not had a bowel movement in three days.  You are not able to urinate. Get help right away if:  You have severe  pain in your abdomen.  You have persistent nausea and vomiting.  You have redness, warmth, or pain in your leg.  You have chest pain.  You have trouble breathing. Summary  After this procedure, it is common to have pain, discomfort, or soreness.  Follow instructions from your health care provider about how to take care of your incision.  Check your incision area every day for signs of infection. Report any signs of infection to your health care provider.  Keep all follow-up visits as told by your health care provider. This is important. This information is not intended to replace advice given to you by your health care provider. Make sure you discuss any questions you have with your health care provider. Document Released: 02/01/2012 Document Revised: 10/07/2015 Document Reviewed: 10/07/2015 Elsevier Interactive Patient Education  2019 [employer].

## 2018-08-21 ENCOUNTER — Encounter (HOSPITAL_COMMUNITY): Payer: Self-pay | Admitting: General Surgery

## 2018-08-30 ENCOUNTER — Telehealth (INDEPENDENT_AMBULATORY_CARE_PROVIDER_SITE_OTHER): Payer: Self-pay | Admitting: General Surgery

## 2018-08-30 ENCOUNTER — Other Ambulatory Visit: Payer: Self-pay

## 2018-08-30 DIAGNOSIS — K439 Ventral hernia without obstruction or gangrene: Secondary | ICD-10-CM

## 2018-08-31 NOTE — Progress Notes (Signed)
[facility]  I am calling the patient for post operative evaluation due to the current COVID 19 pandemic. I left the patient a message on Friday 08/31/2018.   Called him back about 1 hour later and again this went to his voicemail. He underwent a laparoscopic ventral hernia repair with mesh on 08/20/18.     I gave the voicemail the [phone] number in case he has any issues. I was unable to call him 08/30/18 due to having an emergency surgery.    I will plan to call him Tuesday 09/04/18.   Curlene Labrum, MD [facility] [address] [place], [place] [address] [phone] (office)

## 2018-09-11 ENCOUNTER — Telehealth: Payer: Self-pay | Admitting: General Surgery

## 2018-09-11 ENCOUNTER — Other Ambulatory Visit: Payer: Self-pay

## 2018-09-11 ENCOUNTER — Ambulatory Visit (INDEPENDENT_AMBULATORY_CARE_PROVIDER_SITE_OTHER): Payer: Self-pay | Admitting: General Surgery

## 2018-09-11 ENCOUNTER — Encounter: Payer: Self-pay | Admitting: General Surgery

## 2018-09-11 VITALS — BP 100/67 | HR 72 | Temp 97.7°F | Resp 18 | Ht 73.0 in | Wt 211.0 lb

## 2018-09-11 DIAGNOSIS — K439 Ventral hernia without obstruction or gangrene: Secondary | ICD-10-CM

## 2018-09-11 MED ORDER — OXYCODONE HCL 5 MG PO TABS: 5.0000 mg | ORAL_TABLET | ORAL | 0 refills | Status: DC | PRN

## 2018-09-11 NOTE — Patient Instructions (Signed)
Walk everyday for at least 15-20 minutes.  Deep cough and move around every 1-2 hours in the first few days after surgery.  Do not lift > 10 lbs, perform excessive bending, pushing, pulling, squatting for 8 weeks after surgery.  The activity restrictions and the abdominal binder are to prevent hernia formation at your incision while you are healing.  Do not place lotions or balms on your incision unless instructed to specifically by Dr. Henreitta LeberBridges.   Medication: Take tylenol and ibuprofen as needed for pain control, alternating every 4-6 hours.  Example:  Tylenol 1000mg  @ 6am, 12noon, 6pm, 12midnight (Do not exceed 4000mg  of tylenol a day). Ibuprofen 800mg  @ 9am, 3pm, 9pm, 3am (Do not exceed 3600mg  of ibuprofen a day).  Take Roxicodone for breakthrough pain every 4 hours.  Take Colace for constipation related to narcotic pain medication. If you do not have a bowel movement in 2 days, take Miralax over the counter.  Drink plenty of water to also prevent constipation.   Contact Information: If you have questions or concerns, please call our office, [phone], Monday- Thursday 8AM-5PM and Friday 8AM-12Noon.  If it is after hours or on the weekend, please call [facility], [phone], and ask to speak to the surgeon on call for Dr. Henreitta [facility]berBridges at [facility].     Seroma A seroma is a collection of fluid on the body that looks like swelling or a mass. Seromas form where tissue has been injured or cut. Seromas vary in size. Some are small and painless. Others may become large and cause pain or discomfort. Many seromas go away on their own as the fluid is naturally absorbed by the body, and some seromas need to be drained. What are the causes? Seromas form as the result of damage to tissue or the removal of tissue. This tissue damage may occur during surgery or because of an injury or trauma. When tissue is disrupted or removed, empty space is created. The body's natural defense system (immune  system) causes fluid to enter the empty space and form a seroma. What are the signs or symptoms? Symptoms of this condition include:  Swelling at the site of a surgical cut (incision) or an injury.  Drainage of clear fluid at the surgery or injury site.  Discomfort or pain. How is this diagnosed? This condition is diagnosed based on your symptoms, your medical history, and a physical exam. During the exam, your health care provider will press on the seroma. You may also have tests, including:  Blood tests.  Imaging tests, such as an ultrasound or CT scan. How is this treated? Some seromas go away (resolve) on their own. Your health care provider may monitor you to make sure the seroma does not cause any complications. If your seroma does not resolve on its own, treatment may include:  Using a needle to drain the fluid from the seroma (needle aspiration).  Inserting a flexible tube (catheter) to drain the fluid.  Applying a bandage (dressing), such as an elastic bandage or binder.  Antibiotic medicines, if the seroma becomes infected. In rare cases, surgery may be done to remove the seroma and repair the area. Follow these instructions at home:   If you were prescribed an antibiotic medicine, take it as told by your health care provider. Do not stop taking the antibiotic even if you start to feel better.  Return to your normal activities as told by your health care provider. Ask your health care provider what activities  are safe for you.  Take over-the-counter and prescription medicines only as told by your health care provider.  Check your seroma every day for signs of infection. Check for: ? Redness or pain. ? Fluid or pus. ? More swelling. ? Warmth.  Keep all follow-up visits as told by your health care provider. This is important. Contact a health care provider if:  You have a fever.  You have redness or pain at the site of the seroma.  You have fluid or pus coming  from the seroma.  Your seroma is more swollen or is getting bigger.  Your seroma is warm to the touch. This information is not intended to replace advice given to you by your health care provider. Make sure you discuss any questions you have with your health care provider. Document Released: 06/11/2012 Document Revised: 01/27/2017 Document Reviewed: 11/27/2015 Elsevier Patient Education  2020 [employer].

## 2018-09-11 NOTE — Progress Notes (Signed)
[facility]   HPI:  43 y.o. Male presents to clinic for follow-up evaluation of his ventral hernia repair. He says that last Friday he noticed more of a bulge and some tenderness overlying the hernia. He was worried and called Dr. Arnoldo Morale. He has otherwise been well and not having issues. His phone was causing him trouble when I attempted to call the other week. He is still out on FMLA. He is needing some pain medication at night for soreness but feels like he is moving around better.   Review of Systems:  No fever or chills No redness  Soreness and bulge over the hernia site  All other review of systems: otherwise negative   Vital Signs:  BP 100/67 (BP Location: Left Arm, Patient Position: Sitting, Cuff Size: Normal)   Pulse 72   Temp 97.7 F (36.5 C) (Tympanic)   Resp 18   Ht 6\' 1"  (1.854 m)   Wt 211 lb (95.7 kg)   SpO2 96%   BMI 27.84 kg/m    Physical Exam:  Physical Exam HENT:     Head: Normocephalic.  Cardiovascular:     Rate and Rhythm: Normal rate.  Pulmonary:     Effort: Pulmonary effort is normal.  Abdominal:     General: There is no distension.     Palpations: Abdomen is soft.     Tenderness: There is abdominal tenderness.     Comments: Epigastric area overlying hernia with seroma, [place] used to demonstrate to patient, fascia appears intact inferiorly, seroma overlying under the skin  Neurological:     Mental Status: He is alert.    Assessment:  43 y.o. yo Male with a post operative seroma over the laparoscopic ventral hernia repair with mesh. We discussed the pathology of seroma and the possibility that the body will reabsorb this area. We discussed the option for drainage but preference to allow things to heal and see if this absorbs to not seed the fluid and cause infection.    Plan:  - Rx for pain medication sent in   - Seroma will be monitored and patient knows to watch for signs of infection  - Will see back in a few weeks to assess  and decide if we need to drain any at that time  Future Appointments  Date Time Provider [place]  09/25/2018  9:30 AM Virl Cagey, MD [facility] None   All of the above recommendations were discussed with the patient, and all of patient's questions were answered to his expressed satisfaction.  Curlene Labrum, MD [facility] [address] [place], [place] [address] [phone] (office)

## 2018-09-21 DIAGNOSIS — J019 Acute sinusitis, unspecified: Secondary | ICD-10-CM | POA: Diagnosis not present

## 2018-09-21 DIAGNOSIS — M272 Inflammatory conditions of jaws: Secondary | ICD-10-CM | POA: Diagnosis not present

## 2018-09-25 ENCOUNTER — Encounter: Payer: Self-pay | Admitting: General Surgery

## 2018-09-25 ENCOUNTER — Ambulatory Visit (INDEPENDENT_AMBULATORY_CARE_PROVIDER_SITE_OTHER): Payer: Self-pay | Admitting: General Surgery

## 2018-09-25 ENCOUNTER — Other Ambulatory Visit: Payer: Self-pay

## 2018-09-25 VITALS — BP 125/77 | HR 82 | Temp 98.9°F | Resp 16 | Ht 73.0 in | Wt 209.0 lb

## 2018-09-25 DIAGNOSIS — K439 Ventral hernia without obstruction or gangrene: Secondary | ICD-10-CM

## 2018-09-25 MED ORDER — OXYCODONE HCL 5 MG PO TABS: 5.0000 mg | ORAL_TABLET | Freq: Four times a day (QID) | ORAL | 0 refills | Status: AC | PRN

## 2018-09-25 NOTE — Progress Notes (Signed)
[facility] Note   HPI:  43 y.o. Male presents to clinic for follow-up evaluation of his ventral hernia repair. He is doing ok but has significant face pain and swelling from a facial abscess. He is being treated with antibiotics and says he has had some improvement. He went to urgent care to have this evaluated this weekend.  Tolerating his diet but limited by facial swelling. Having regular Bms.   Review of Systems:  Facial pain and swelling Having Bms Pain in the left side around port site esp with movement  Seroma decreasing in size  All other review of systems: otherwise negative   Vital Signs:  BP 125/77 (BP Location: Left Arm, Patient Position: Sitting, Cuff Size: Normal)   Pulse 82   Temp 98.9 F (37.2 C) (Tympanic)   Resp 16   Ht 6\' 1"  (1.854 m)   Wt 209 lb (94.8 kg)   SpO2 97%   BMI 27.57 kg/m    Physical Exam:  Physical Exam Vitals signs reviewed.  HENT:     Head:     Comments: Swollen right cheek    Nose: Nose normal.  Eyes:     Pupils: Pupils are equal, round, and reactive to light.  Cardiovascular:     Rate and Rhythm: Normal rate.  Abdominal:     General: There is no distension.     Palpations: Abdomen is soft.     Tenderness: There is no abdominal tenderness.     Comments: Seroma in the midline, slightly smaller  Musculoskeletal: Normal range of motion.        General: No swelling.  Skin:    General: Skin is warm and dry.  Neurological:     General: No focal deficit present.     Mental Status: He is alert and oriented to person, place, and time.  Psychiatric:        Mood and Affect: Mood normal.        Behavior: Behavior normal.        Thought Content: Thought content normal.        Judgment: Judgment normal.     Assessment:  43 y.o. yo Male s/p laparoscopic ventral hernia repair with mesh. His seroma is smaller and he is still having some abdominal pain with movement. He currently has a face abscess that is limiting his food  intake, but he is having Bms.   Plan:  -Instructed him to go to ED if facial swelling worsens. He may need ENT/ oral surgery to drain and abscess; Due to COVID and no N95, I did not look into his mouth   -Roxicodone for left sided pain refilled one last time, he uses mostly at night  -Will see him back before going to work and see if we need to drain seroma. Do not want to drain with the active face infection.   Future Appointments  Date Time Provider [place]  10/11/2018  1:30 PM Virl Cagey, MD [facility] None    All of the above recommendations were discussed with the patient, and all of patient's questions were answered to his expressed satisfaction.  Curlene Labrum, MD [facility] [address] [place], [place] [address] [phone] (office)

## 2018-09-25 NOTE — Patient Instructions (Signed)
Follow up in a few weeks to get cleared for return to work. Will assess seroma then also. Go to ED with worsening facial swelling, fevers, pain.

## 2018-10-11 ENCOUNTER — Ambulatory Visit (INDEPENDENT_AMBULATORY_CARE_PROVIDER_SITE_OTHER): Payer: Self-pay | Admitting: General Surgery

## 2018-10-11 ENCOUNTER — Other Ambulatory Visit: Payer: Self-pay

## 2018-10-11 ENCOUNTER — Encounter: Payer: Self-pay | Admitting: General Surgery

## 2018-10-11 VITALS — BP 112/78 | HR 86 | Temp 97.1°F | Resp 16 | Ht 73.0 in | Wt 210.0 lb

## 2018-10-11 DIAGNOSIS — K439 Ventral hernia without obstruction or gangrene: Secondary | ICD-10-CM

## 2018-10-11 NOTE — Progress Notes (Signed)
[facility] Note   HPI:  43 y.o. Male presents to clinic for follow-up evaluation after a laparoscopic ventral hernia repair. He is doing well. His seroma is less swollen and tender. It is decreasing in size. His face abscess has resolved.   Review of Systems:  No fever or chills Seroma decreasing in size  All other review of systems: otherwise negative   Vital Signs:  BP 112/78 (BP Location: Left Arm, Patient Position: Sitting, Cuff Size: Normal)   Pulse 86   Temp (!) 97.1 F (36.2 C) (Temporal)   Resp 16   Ht 6\' 1"  (1.854 m)   Wt 210 lb (95.3 kg)   SpO2 96%   BMI 27.71 kg/m    Physical Exam:  Physical Exam Constitutional:      Appearance: Normal appearance.  HENT:     Mouth/Throat:     Mouth: Mucous membranes are moist.  Cardiovascular:     Rate and Rhythm: Normal rate.  Pulmonary:     Effort: Pulmonary effort is normal.  Abdominal:     General: There is no distension.     Palpations: Abdomen is soft.     Tenderness: There is no abdominal tenderness.     Hernia: No hernia is present.     Comments: Seroma < 2cm, soft  Neurological:     Mental Status: He is alert.      Assessment:  43 y.o. yo Male s/p laparoscopic ventral hernia repair with mesh with seroma formation over the mesh. This is getting smaller. The patient is feeling much better and is ready to get back to more activity.   Plan:  - Can return to work 8/17 without restrictions  - Seroma should continue to get smaller, call if you are concerned or if need to have it aspirated  -  Continue activity as desired   All of the above recommendations were discussed with the patient, and all of patient's questions were answered to his expressed satisfaction.  Curlene Labrum, MD [facility] [address] [place], [place] [address] [phone] (office)

## 2018-10-11 NOTE — Patient Instructions (Signed)
Can return to work on 10/15/2018. If you have issues call. Seroma should continue to get smaller.

## 2018-11-23 DIAGNOSIS — M79672 Pain in left foot: Secondary | ICD-10-CM | POA: Diagnosis not present

## 2018-11-23 DIAGNOSIS — M79671 Pain in right foot: Secondary | ICD-10-CM | POA: Diagnosis not present

## 2018-11-23 DIAGNOSIS — M2042 Other hammer toe(s) (acquired), left foot: Secondary | ICD-10-CM | POA: Diagnosis not present

## 2018-11-23 DIAGNOSIS — G5762 Lesion of plantar nerve, left lower limb: Secondary | ICD-10-CM | POA: Diagnosis not present

## 2018-11-23 DIAGNOSIS — B079 Viral wart, unspecified: Secondary | ICD-10-CM | POA: Diagnosis not present

## 2018-11-23 DIAGNOSIS — G5761 Lesion of plantar nerve, right lower limb: Secondary | ICD-10-CM | POA: Diagnosis not present

## 2018-11-23 DIAGNOSIS — M2041 Other hammer toe(s) (acquired), right foot: Secondary | ICD-10-CM | POA: Diagnosis not present

## 2018-12-07 DIAGNOSIS — B079 Viral wart, unspecified: Secondary | ICD-10-CM | POA: Diagnosis not present

## 2020-02-05 DIAGNOSIS — M47816 Spondylosis without myelopathy or radiculopathy, lumbar region: Secondary | ICD-10-CM | POA: Diagnosis not present

## 2020-02-05 DIAGNOSIS — M47812 Spondylosis without myelopathy or radiculopathy, cervical region: Secondary | ICD-10-CM | POA: Diagnosis not present

## 2020-02-05 DIAGNOSIS — M5481 Occipital neuralgia: Secondary | ICD-10-CM | POA: Diagnosis not present

## 2020-02-05 DIAGNOSIS — Z6827 Body mass index (BMI) 27.0-27.9, adult: Secondary | ICD-10-CM | POA: Diagnosis not present

## 2020-02-05 DIAGNOSIS — M5417 Radiculopathy, lumbosacral region: Secondary | ICD-10-CM | POA: Diagnosis not present

## 2020-02-05 DIAGNOSIS — G8929 Other chronic pain: Secondary | ICD-10-CM | POA: Diagnosis not present

## 2020-02-05 DIAGNOSIS — M5412 Radiculopathy, cervical region: Secondary | ICD-10-CM | POA: Diagnosis not present

## 2020-02-05 DIAGNOSIS — Z1331 Encounter for screening for depression: Secondary | ICD-10-CM | POA: Diagnosis not present

## 2022-02-14 DIAGNOSIS — R634 Abnormal weight loss: Secondary | ICD-10-CM | POA: Diagnosis not present

## 2022-02-14 DIAGNOSIS — Z0001 Encounter for general adult medical examination with abnormal findings: Secondary | ICD-10-CM | POA: Diagnosis not present

## 2022-04-14 DIAGNOSIS — H33333 Multiple defects of retina without detachment, bilateral: Secondary | ICD-10-CM | POA: Diagnosis not present

## 2022-04-14 DIAGNOSIS — H43823 Vitreomacular adhesion, bilateral: Secondary | ICD-10-CM | POA: Diagnosis not present

## 2022-04-22 DIAGNOSIS — H33331 Multiple defects of retina without detachment, right eye: Secondary | ICD-10-CM | POA: Diagnosis not present

## 2022-08-12 DIAGNOSIS — R590 Localized enlarged lymph nodes: Secondary | ICD-10-CM | POA: Diagnosis not present

## 2022-08-12 DIAGNOSIS — F988 Other specified behavioral and emotional disorders with onset usually occurring in childhood and adolescence: Secondary | ICD-10-CM | POA: Diagnosis not present

## 2022-08-12 DIAGNOSIS — Z6823 Body mass index (BMI) 23.0-23.9, adult: Secondary | ICD-10-CM | POA: Diagnosis not present

## 2022-08-12 DIAGNOSIS — K047 Periapical abscess without sinus: Secondary | ICD-10-CM | POA: Diagnosis not present

## 2022-11-10 DIAGNOSIS — F988 Other specified behavioral and emotional disorders with onset usually occurring in childhood and adolescence: Secondary | ICD-10-CM | POA: Diagnosis not present

## 2022-11-10 DIAGNOSIS — Z6824 Body mass index (BMI) 24.0-24.9, adult: Secondary | ICD-10-CM | POA: Diagnosis not present

## 2023-03-10 DIAGNOSIS — Z6825 Body mass index (BMI) 25.0-25.9, adult: Secondary | ICD-10-CM | POA: Diagnosis not present

## 2023-03-10 DIAGNOSIS — F900 Attention-deficit hyperactivity disorder, predominantly inattentive type: Secondary | ICD-10-CM | POA: Diagnosis not present

## 2023-06-22 DIAGNOSIS — E663 Overweight: Secondary | ICD-10-CM | POA: Diagnosis not present

## 2023-06-22 DIAGNOSIS — Z6825 Body mass index (BMI) 25.0-25.9, adult: Secondary | ICD-10-CM | POA: Diagnosis not present

## 2023-06-22 DIAGNOSIS — F900 Attention-deficit hyperactivity disorder, predominantly inattentive type: Secondary | ICD-10-CM | POA: Diagnosis not present

## 2024-01-08 DIAGNOSIS — F900 Attention-deficit hyperactivity disorder, predominantly inattentive type: Secondary | ICD-10-CM | POA: Diagnosis not present
